# Patient Record
Sex: Female | Born: 1965 | Race: White | Hispanic: No | Marital: Married | Smoking: Never smoker
Health system: Southern US, Community
[De-identification: ages and names within clinical notes are randomized; demographics above are authoritative.]

## PROBLEM LIST (undated history)

## (undated) DIAGNOSIS — R569 Unspecified convulsions: Secondary | ICD-10-CM

## (undated) DIAGNOSIS — R413 Other amnesia: Secondary | ICD-10-CM

## (undated) DIAGNOSIS — N951 Menopausal and female climacteric states: Secondary | ICD-10-CM

## (undated) HISTORY — DX: Unspecified convulsions: R56.9

## (undated) HISTORY — PX: OTHER SURGICAL HISTORY: SHX169

## (undated) HISTORY — DX: Menopausal and female climacteric states: N95.1

## (undated) HISTORY — PX: BRAIN SURGERY: SHX531

## (undated) HISTORY — DX: Other amnesia: R41.3

---

## 2005-09-04 HISTORY — PX: ABOVE ELBOW ARM AMPUTATION: SUR27

## 2007-04-08 ENCOUNTER — Ambulatory Visit (HOSPITAL_COMMUNITY): Admission: RE | Admit: 2007-04-08 | Discharge: 2007-04-08 | Payer: Self-pay | Admitting: Internal Medicine

## 2007-04-24 ENCOUNTER — Ambulatory Visit (HOSPITAL_COMMUNITY): Admission: RE | Admit: 2007-04-24 | Discharge: 2007-04-24 | Payer: Self-pay | Admitting: Internal Medicine

## 2007-10-30 ENCOUNTER — Ambulatory Visit (HOSPITAL_COMMUNITY): Admission: RE | Admit: 2007-10-30 | Discharge: 2007-10-30 | Payer: Self-pay | Admitting: Internal Medicine

## 2008-04-15 ENCOUNTER — Ambulatory Visit (HOSPITAL_COMMUNITY): Admission: RE | Admit: 2008-04-15 | Discharge: 2008-04-15 | Payer: Self-pay | Admitting: Internal Medicine

## 2008-06-03 ENCOUNTER — Other Ambulatory Visit: Admission: RE | Admit: 2008-06-03 | Discharge: 2008-06-03 | Payer: Self-pay | Admitting: Obstetrics and Gynecology

## 2008-07-22 ENCOUNTER — Encounter: Payer: Self-pay | Admitting: Orthopedic Surgery

## 2008-08-04 ENCOUNTER — Encounter: Payer: Self-pay | Admitting: Orthopedic Surgery

## 2008-09-04 ENCOUNTER — Encounter: Payer: Self-pay | Admitting: Orthopedic Surgery

## 2008-10-05 ENCOUNTER — Encounter: Payer: Self-pay | Admitting: Orthopedic Surgery

## 2008-11-02 ENCOUNTER — Encounter: Payer: Self-pay | Admitting: Orthopedic Surgery

## 2009-04-19 ENCOUNTER — Ambulatory Visit (HOSPITAL_COMMUNITY): Admission: RE | Admit: 2009-04-19 | Discharge: 2009-04-19 | Payer: Self-pay | Admitting: Internal Medicine

## 2009-06-17 ENCOUNTER — Other Ambulatory Visit: Admission: RE | Admit: 2009-06-17 | Discharge: 2009-06-17 | Payer: Self-pay | Admitting: Obstetrics and Gynecology

## 2010-04-21 ENCOUNTER — Ambulatory Visit (HOSPITAL_COMMUNITY): Admission: RE | Admit: 2010-04-21 | Discharge: 2010-04-21 | Payer: Self-pay | Admitting: Internal Medicine

## 2010-07-21 ENCOUNTER — Other Ambulatory Visit: Admission: RE | Admit: 2010-07-21 | Discharge: 2010-07-21 | Payer: Self-pay | Admitting: Obstetrics and Gynecology

## 2011-03-20 ENCOUNTER — Other Ambulatory Visit (HOSPITAL_COMMUNITY): Payer: Self-pay | Admitting: Internal Medicine

## 2011-03-20 DIAGNOSIS — Z139 Encounter for screening, unspecified: Secondary | ICD-10-CM

## 2011-04-25 ENCOUNTER — Ambulatory Visit (HOSPITAL_COMMUNITY): Payer: Medicare Other

## 2011-05-01 ENCOUNTER — Ambulatory Visit (HOSPITAL_COMMUNITY)
Admission: RE | Admit: 2011-05-01 | Discharge: 2011-05-01 | Disposition: A | Discharge status: Home / Self Care | Payer: Medicare Other | Source: Ambulatory Visit | Attending: Internal Medicine | Admitting: Internal Medicine

## 2011-05-01 DIAGNOSIS — Z139 Encounter for screening, unspecified: Secondary | ICD-10-CM

## 2011-05-01 DIAGNOSIS — Z1231 Encounter for screening mammogram for malignant neoplasm of breast: Secondary | ICD-10-CM | POA: Insufficient documentation

## 2011-07-26 ENCOUNTER — Other Ambulatory Visit (HOSPITAL_COMMUNITY)
Admission: RE | Admit: 2011-07-26 | Discharge: 2011-07-26 | Disposition: A | Discharge status: Home / Self Care | Payer: Medicare Other | Source: Ambulatory Visit | Attending: Obstetrics and Gynecology | Admitting: Obstetrics and Gynecology

## 2011-07-26 ENCOUNTER — Other Ambulatory Visit: Payer: Self-pay | Admitting: Family Medicine

## 2011-07-26 DIAGNOSIS — Z01419 Encounter for gynecological examination (general) (routine) without abnormal findings: Secondary | ICD-10-CM | POA: Insufficient documentation

## 2012-07-29 ENCOUNTER — Other Ambulatory Visit (HOSPITAL_COMMUNITY)
Admission: RE | Admit: 2012-07-29 | Discharge: 2012-07-29 | Disposition: A | Discharge status: Home / Self Care | Payer: Medicare Other | Source: Ambulatory Visit | Attending: Obstetrics and Gynecology | Admitting: Obstetrics and Gynecology

## 2012-07-29 ENCOUNTER — Other Ambulatory Visit: Payer: Self-pay | Admitting: Adult Health

## 2012-07-29 ENCOUNTER — Other Ambulatory Visit (HOSPITAL_COMMUNITY): Payer: Self-pay | Admitting: Internal Medicine

## 2012-07-29 DIAGNOSIS — Z1151 Encounter for screening for human papillomavirus (HPV): Secondary | ICD-10-CM | POA: Insufficient documentation

## 2012-07-29 DIAGNOSIS — Z01419 Encounter for gynecological examination (general) (routine) without abnormal findings: Secondary | ICD-10-CM | POA: Insufficient documentation

## 2012-07-29 DIAGNOSIS — Z139 Encounter for screening, unspecified: Secondary | ICD-10-CM

## 2012-08-06 ENCOUNTER — Ambulatory Visit (HOSPITAL_COMMUNITY): Payer: Medicare Other

## 2012-08-12 ENCOUNTER — Ambulatory Visit (HOSPITAL_COMMUNITY)
Admission: RE | Admit: 2012-08-12 | Discharge: 2012-08-12 | Disposition: A | Discharge status: Home / Self Care | Payer: Medicare Other | Source: Ambulatory Visit | Attending: Internal Medicine | Admitting: Internal Medicine

## 2012-08-12 DIAGNOSIS — Z139 Encounter for screening, unspecified: Secondary | ICD-10-CM

## 2012-08-12 DIAGNOSIS — Z1231 Encounter for screening mammogram for malignant neoplasm of breast: Secondary | ICD-10-CM | POA: Insufficient documentation

## 2012-08-13 DIAGNOSIS — R569 Unspecified convulsions: Secondary | ICD-10-CM | POA: Insufficient documentation

## 2013-07-07 ENCOUNTER — Other Ambulatory Visit (HOSPITAL_COMMUNITY): Payer: Self-pay | Admitting: Internal Medicine

## 2013-07-07 DIAGNOSIS — Z139 Encounter for screening, unspecified: Secondary | ICD-10-CM

## 2013-08-04 ENCOUNTER — Encounter: Payer: Self-pay | Admitting: Adult Health

## 2013-08-04 ENCOUNTER — Ambulatory Visit (INDEPENDENT_AMBULATORY_CARE_PROVIDER_SITE_OTHER): Payer: No Typology Code available for payment source | Admitting: Adult Health

## 2013-08-04 ENCOUNTER — Encounter (INDEPENDENT_AMBULATORY_CARE_PROVIDER_SITE_OTHER): Payer: Self-pay

## 2013-08-04 VITALS — BP 120/80 | HR 76 | Ht 65.0 in | Wt 242.0 lb

## 2013-08-04 DIAGNOSIS — N951 Menopausal and female climacteric states: Secondary | ICD-10-CM

## 2013-08-04 DIAGNOSIS — Z01419 Encounter for gynecological examination (general) (routine) without abnormal findings: Secondary | ICD-10-CM

## 2013-08-04 DIAGNOSIS — Z1212 Encounter for screening for malignant neoplasm of rectum: Secondary | ICD-10-CM

## 2013-08-04 HISTORY — DX: Menopausal and female climacteric states: N95.1

## 2013-08-04 LAB — HEMOCCULT GUIAC POC 1CARD (OFFICE)
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
## 2013-08-04 NOTE — Patient Instructions (Signed)
Physical in 1 year Mammogram yearly Colonoscopy at 39 Labs with PCP Perimenopause Perimenopause is the time when your body begins to move into the menopause (no menstrual period for 12 straight months). It is a natural process. Perimenopause can begin 2 to 8 years before the menopause and usually lasts for one year after the menopause. During this time, your ovaries may or may not produce an egg. The ovaries vary in their production of estrogen and progesterone hormones each month. This can cause irregular menstrual periods, difficulty in getting pregnant, vaginal bleeding between periods and uncomfortable symptoms. CAUSES  Irregular production of the ovarian hormones, estrogen and progesterone, and not ovulating every month.  Other causes include:  Tumor of the pituitary gland in the brain.  Medical disease that affects the ovaries.  Radiation treatment.  Chemotherapy.  Unknown causes.  Heavy smoking and excessive alcohol intake can bring on perimenopause sooner. SYMPTOMS   Hot flashes.  Night sweats.  Irregular menstrual periods.  Decrease sex drive.  Vaginal dryness.  Headaches.  Mood swings.  Depression.  Memory problems.  Irritability.  Tiredness.  Weight gain.  Trouble getting pregnant.  The beginning of losing bone cells (osteoporosis).  The beginning of hardening of the arteries (atherosclerosis). DIAGNOSIS  Your caregiver will make a diagnosis by analyzing your age, menstrual history and your symptoms. They will do a physical exam noting any changes in your body, especially your female organs. Female hormone tests may or may not be helpful depending on the amount and when you produce the female hormones. However, other hormone tests may be helpful (ex. thyroid hormone) to rule out other problems. TREATMENT  The decision to treat during the perimenopause should be made by you and your caregiver depending on how the symptoms are affecting you and your  life style. There are various treatments available such as:  Treating individual symptoms with a specific medication for that symptom (ex. tranquilizer for depression).  Herbal medications that can help specific symptoms.  Counseling.  Group therapy.  No treatment. HOME CARE INSTRUCTIONS   Before seeing your caregiver, make a list of your menstrual periods (when the occur, how heavy they are, how long between periods and how long they last), your symptoms and when they started.  Take the medication as recommended by your caregiver.  Sleep and rest.  Exercise.  Eat a diet that contains calcium (good for your bones) and soy (acts like estrogen hormone).  Do not smoke.  Avoid alcoholic beverages.  Taking vitamin E may help in certain cases.  Take calcium and vitamin D supplements to help prevent bone loss.  Group therapy is sometimes helpful.  Acupuncture may help in some cases. SEEK MEDICAL CARE IF:   You have any of the above and want to know if it is perimenopause.  You want advice and treatment for any of your symptoms mentioned above.  You need a referral to a specialist (gynecologist, psychiatrist or psychologist). SEEK IMMEDIATE MEDICAL CARE IF:   You have vaginal bleeding.  Your period lasts longer than 8 days.  You periods are recurring sooner than 21 days.  You have bleeding after intercourse.  You have severe depression.  You have pain when you urinate.  You have severe headaches.  You develop vision problems. Document Released: 09/28/2004 Document Revised: 11/13/2011 Document Reviewed: 03/20/2013 [facility] Patient Information 2014 [place], [place].

## 2013-08-18 ENCOUNTER — Ambulatory Visit (HOSPITAL_COMMUNITY)
Admission: RE | Admit: 2013-08-18 | Discharge: 2013-08-18 | Disposition: A | Discharge status: Home / Self Care | Payer: Medicare Other | Source: Ambulatory Visit | Attending: Internal Medicine | Admitting: Internal Medicine

## 2013-08-18 DIAGNOSIS — Z139 Encounter for screening, unspecified: Secondary | ICD-10-CM

## 2013-08-18 DIAGNOSIS — Z1231 Encounter for screening mammogram for malignant neoplasm of breast: Secondary | ICD-10-CM | POA: Insufficient documentation

## 2014-04-28 ENCOUNTER — Other Ambulatory Visit (HOSPITAL_COMMUNITY): Payer: Self-pay | Admitting: Internal Medicine

## 2014-04-28 DIAGNOSIS — Z1231 Encounter for screening mammogram for malignant neoplasm of breast: Secondary | ICD-10-CM

## 2014-08-10 ENCOUNTER — Ambulatory Visit (INDEPENDENT_AMBULATORY_CARE_PROVIDER_SITE_OTHER): Payer: No Typology Code available for payment source | Admitting: Adult Health

## 2014-08-10 ENCOUNTER — Encounter: Payer: Self-pay | Admitting: Adult Health

## 2014-08-10 VITALS — BP 124/80 | HR 76 | Ht 66.0 in | Wt 244.5 lb

## 2014-08-10 DIAGNOSIS — N951 Menopausal and female climacteric states: Secondary | ICD-10-CM

## 2014-08-10 DIAGNOSIS — Z01419 Encounter for gynecological examination (general) (routine) without abnormal findings: Secondary | ICD-10-CM

## 2014-08-10 NOTE — Patient Instructions (Addendum)
Pap and physical in 1 year Mammogram 12/17 and yearly  Colonoscopy at [place] to take ASA 81 mg Vitamin D3 2000 IU daily Menopause Menopause is the normal time of life when menstrual periods stop completely. Menopause is complete when you have missed 12 consecutive menstrual periods. It usually occurs between the ages of 16 years and 40 years. Very rarely does a woman develop menopause before the age of 4 years. At menopause, your ovaries stop producing the female hormones estrogen and progesterone. This can cause undesirable symptoms and also affect your health. Sometimes the symptoms may occur 4-5 years before the menopause begins. There is no relationship between menopause and:  Oral contraceptives.  Number of children you had.  Race.  The age your menstrual periods started (menarche). Heavy smokers and very thin women may develop menopause earlier in life. CAUSES  The ovaries stop producing the female hormones estrogen and progesterone.  Other causes include:  Surgery to remove both ovaries.  The ovaries stop functioning for no known reason.  Tumors of the pituitary gland in the brain.  Medical disease that affects the ovaries and hormone production.  Radiation treatment to the abdomen or pelvis.  Chemotherapy that affects the ovaries. SYMPTOMS   Hot flashes.  Night sweats.  Decrease in sex drive.  Vaginal dryness and thinning of the vagina causing painful intercourse.  Dryness of the skin and developing wrinkles.  Headaches.  Tiredness.  Irritability.  Memory problems.  Weight gain.  Bladder infections.  Hair growth of the face and chest.  Infertility. More serious symptoms include:  Loss of bone (osteoporosis) causing breaks (fractures).  Depression.  Hardening and narrowing of the arteries (atherosclerosis) causing heart attacks and strokes. DIAGNOSIS   When the menstrual periods have stopped for 12 straight months.  Physical  exam.  Hormone studies of the blood. TREATMENT  There are many treatment choices and nearly as many questions about them. The decisions to treat or not to treat menopausal changes is an individual choice made with your health care provider. Your health care provider can discuss the treatments with you. Together, you can decide which treatment will work best for you. Your treatment choices may include:   Hormone therapy (estrogen and progesterone).  Non-hormonal medicines.  Treating the individual symptoms with medicine (for example antidepressants for depression).  Herbal medicines that may help specific symptoms.  Counseling by a psychiatrist or psychologist.  Group therapy.  Lifestyle changes including:  Eating healthy.  Regular exercise.  Limiting caffeine and alcohol.  Stress management and meditation.  No treatment. HOME CARE INSTRUCTIONS   Take the medicine your health care provider gives you as directed.  Get plenty of sleep and rest.  Exercise regularly.  Eat a diet that contains calcium (good for the bones) and soy products (acts like estrogen hormone).  Avoid alcoholic beverages.  Do not smoke.  If you have hot flashes, dress in layers.  Take supplements, calcium, and vitamin D to strengthen bones.  You can use over-the-counter lubricants or moisturizers for vaginal dryness.  Group therapy is sometimes very helpful.  Acupuncture may be helpful in some cases. SEEK MEDICAL CARE IF:   You are not sure you are in menopause.  You are having menopausal symptoms and need advice and treatment.  You are still having menstrual periods after age 57 years.  You have pain with intercourse.  Menopause is complete (no menstrual period for 12 months) and you develop vaginal bleeding.  You need a referral to a  specialist (gynecologist, psychiatrist, or psychologist) for treatment. SEEK IMMEDIATE MEDICAL CARE IF:   You have severe depression.  You have  excessive vaginal bleeding.  You fell and think you have a broken bone.  You have pain when you urinate.  You develop leg or chest pain.  You have a fast pounding heart beat (palpitations).  You have severe headaches.  You develop vision problems.  You feel a lump in your breast.  You have abdominal pain or severe indigestion. Document Released: 11/11/2003 Document Revised: 04/23/2013 Document Reviewed: 03/20/2013 [facility] Patient Information 2015 [place], [place]. This information is not intended to replace advice given to you by your health care provider. Make sure you discuss any questions you have with your health care provider. Perimenopause Perimenopause is the time when your body begins to move into the menopause (no menstrual period for 12 straight months). It is a natural process. Perimenopause can begin 2-8 years before the menopause and usually lasts for 1 year after the menopause. During this time, your ovaries may or may not produce an egg. The ovaries vary in their production of estrogen and progesterone hormones each month. This can cause irregular menstrual periods, difficulty getting pregnant, vaginal bleeding between periods, and uncomfortable symptoms. CAUSES  Irregular production of the ovarian hormones, estrogen and progesterone, and not ovulating every month.  Other causes include:  Tumor of the pituitary gland in the brain.  Medical disease that affects the ovaries.  Radiation treatment.  Chemotherapy.  Unknown causes.  Heavy smoking and excessive alcohol intake can bring on perimenopause sooner. SIGNS AND SYMPTOMS   Hot flashes.  Night sweats.  Irregular menstrual periods.  Decreased sex drive.  Vaginal dryness.  Headaches.  Mood swings.  Depression.  Memory problems.  Irritability.  Tiredness.  Weight gain.  Trouble getting pregnant.  The beginning of losing bone cells (osteoporosis).  The beginning of hardening of the  arteries (atherosclerosis). DIAGNOSIS  Your health care provider will make a diagnosis by analyzing your age, menstrual history, and symptoms. He or she will do a physical exam and note any changes in your body, especially your female organs. Female hormone tests may or may not be helpful depending on the amount of female hormones you produce and when you produce them. However, other hormone tests may be helpful to rule out other problems. TREATMENT  In some cases, no treatment is needed. The decision on whether treatment is necessary during the perimenopause should be made by you and your health care provider based on how the symptoms are affecting you and your lifestyle. Various treatments are available, such as:  Treating individual symptoms with a specific medicine for that symptom.  Herbal medicines that can help specific symptoms.  Counseling.  Group therapy. HOME CARE INSTRUCTIONS   Keep track of your menstrual periods (when they occur, how heavy they are, how long between periods, and how long they last) as well as your symptoms and when they started.  Only take over-the-counter or prescription medicines as directed by your health care provider.  Sleep and rest.  Exercise.  Eat a diet that contains calcium (good for your bones) and soy (acts like the estrogen hormone).  Do not smoke.  Avoid alcoholic beverages.  Take vitamin supplements as recommended by your health care provider. Taking vitamin E may help in certain cases.  Take calcium and vitamin D supplements to help prevent bone loss.  Group therapy is sometimes helpful.  Acupuncture may help in some cases. SEEK MEDICAL CARE IF:  You have questions about any symptoms you are having.  You need a referral to a specialist (gynecologist, psychiatrist, or psychologist). SEEK IMMEDIATE MEDICAL CARE IF:   You have vaginal bleeding.  Your period lasts longer than 8 days.  Your periods are recurring sooner than 21  days.  You have bleeding after intercourse.  You have severe depression.  You have pain when you urinate.  You have severe headaches.  You have vision problems. Document Released: 09/28/2004 Document Revised: 06/11/2013 Document Reviewed: 03/20/2013 [facility] Patient Information 2015 [place], [place]. This information is not intended to replace advice given to you by your health care provider. Make sure you discuss any questions you have with your health care provider.

## 2014-08-10 NOTE — Progress Notes (Signed)
Patient ID: Stacey Ford, female   DOB: 1966/01/31, 48 y.o.   MRN: [id number] History of Present Illness: Stacey Ford is a 48 year old white female in for gyn exam, she had normal pap with negative HPV 07/29/12. Had labs with Dr Willey Blade in [place] and got flu shot in September.  Current Medications, Allergies, Past Medical History, Past Surgical History, Family History and Social History were reviewed in [employer] record.     Review of Systems: Patient denies any headaches, blurred vision, shortness of breath, chest pain, abdominal pain, problems with bowel movements, urination, or intercourse. She has had some palpitations, esp when having hot flash, and not sleeping great, wakes up between 2-3 am.No joint pain or mood swings.    Physical Exam:BP 124/80 mmHg  Pulse 76  Ht 5\' 6"  (1.676 m)  Wt 244 lb 8 oz (110.904 kg)  BMI 39.48 kg/m2  LMP 07/30/2014 General:  Well developed, well nourished, no acute distress Skin:  Warm and dry Neck:  Midline trachea, normal thyroid Lungs; Clear to auscultation bilaterally Breast:  No dominant palpable mass, retraction, or nipple discharge Cardiovascular: Regular rate and rhythm Abdomen:  Soft, non tender, no hepatosplenomegaly Pelvic:  External genitalia is normal in appearance.  The vagina is normal in appearance.  The cervix is smooth.uterus normal size, shape, and contour.  No adnexal masses or tenderness noted. Rectal: Good sphincter tone, no polyps, or hemorrhoids felt.  Hemoccult negative. Extremities:  No swelling or varicosities noted Psych:  No mood changes,alert and cooperative,seems happy Discussed SSRI for hot flashes, will just follow for now  Impression: Well woman gyn exam no pap Peri menopause   Plan: Pap and physical in 1 year Mammogram 12/17 and yearly  Colonoscopy at 49 Take 81 mg ASA daily and vitamin D3 2000 IU daily Call if palpitations increase, may get monitor Review handout on peri menopause and  menopause

## 2014-08-20 ENCOUNTER — Ambulatory Visit (HOSPITAL_COMMUNITY)
Admission: RE | Admit: 2014-08-20 | Discharge: 2014-08-20 | Disposition: A | Discharge status: Home / Self Care | Payer: Medicare Other | Source: Ambulatory Visit | Attending: Internal Medicine | Admitting: Internal Medicine

## 2014-08-20 DIAGNOSIS — Z1231 Encounter for screening mammogram for malignant neoplasm of breast: Secondary | ICD-10-CM | POA: Insufficient documentation

## 2014-09-24 ENCOUNTER — Ambulatory Visit (INDEPENDENT_AMBULATORY_CARE_PROVIDER_SITE_OTHER): Payer: Medicare Other | Admitting: Otolaryngology

## 2014-09-24 DIAGNOSIS — J310 Chronic rhinitis: Secondary | ICD-10-CM

## 2014-09-24 DIAGNOSIS — H6983 Other specified disorders of Eustachian tube, bilateral: Secondary | ICD-10-CM

## 2014-10-16 ENCOUNTER — Encounter (HOSPITAL_COMMUNITY): Payer: Self-pay | Admitting: Emergency Medicine

## 2014-10-16 ENCOUNTER — Emergency Department (HOSPITAL_COMMUNITY): Payer: Medicare Other

## 2014-10-16 ENCOUNTER — Emergency Department (HOSPITAL_COMMUNITY)
Admission: EM | Admit: 2014-10-16 | Discharge: 2014-10-16 | Disposition: A | Discharge status: Home / Self Care | Payer: Medicare Other | Attending: Emergency Medicine | Admitting: Emergency Medicine

## 2014-10-16 DIAGNOSIS — R0602 Shortness of breath: Secondary | ICD-10-CM | POA: Insufficient documentation

## 2014-10-16 DIAGNOSIS — Z7951 Long term (current) use of inhaled steroids: Secondary | ICD-10-CM | POA: Insufficient documentation

## 2014-10-16 DIAGNOSIS — K047 Periapical abscess without sinus: Secondary | ICD-10-CM | POA: Diagnosis not present

## 2014-10-16 DIAGNOSIS — H5710 Ocular pain, unspecified eye: Secondary | ICD-10-CM | POA: Diagnosis not present

## 2014-10-16 DIAGNOSIS — Z880 Allergy status to penicillin: Secondary | ICD-10-CM | POA: Insufficient documentation

## 2014-10-16 DIAGNOSIS — R51 Headache: Secondary | ICD-10-CM | POA: Diagnosis not present

## 2014-10-16 DIAGNOSIS — G40909 Epilepsy, unspecified, not intractable, without status epilepticus: Secondary | ICD-10-CM | POA: Diagnosis not present

## 2014-10-16 DIAGNOSIS — J0100 Acute maxillary sinusitis, unspecified: Secondary | ICD-10-CM | POA: Diagnosis not present

## 2014-10-16 DIAGNOSIS — Z8742 Personal history of other diseases of the female genital tract: Secondary | ICD-10-CM | POA: Insufficient documentation

## 2014-10-16 DIAGNOSIS — Z79899 Other long term (current) drug therapy: Secondary | ICD-10-CM | POA: Insufficient documentation

## 2014-10-16 DIAGNOSIS — R05 Cough: Secondary | ICD-10-CM | POA: Diagnosis present

## 2014-10-16 MED ORDER — HYDROCODONE-ACETAMINOPHEN 5-325 MG PO TABS: 1.0000 | ORAL_TABLET | Freq: Four times a day (QID) | ORAL | Status: DC | PRN

## 2014-10-16 MED ORDER — CLINDAMYCIN HCL 150 MG PO CAPS: 450.0000 mg | ORAL_CAPSULE | Freq: Three times a day (TID) | ORAL | Status: DC

## 2014-10-16 MED ORDER — LEVOFLOXACIN 500 MG PO TABS: 500.0000 mg | ORAL_TABLET | Freq: Every day | ORAL | Status: DC

## 2014-10-16 NOTE — ED Notes (Signed)
MD at bedside. 
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
## 2014-10-16 NOTE — Discharge Instructions (Signed)
As we discussed appears to be a left upper molar tooth infection any acute sinusitis. The tooth infection could've created this is also possible that it went the other way that the sinus infection is eroding into the area of the tooth. Will treat with 2 different antibiotics based on this. Take as directed. Take pain medicine as needed. Keep your follow-up with ear nose and throat. Make an appointment to follow-up with your dentist.

## 2014-10-16 NOTE — ED Notes (Signed)
Pt states that she has been experiencing sinus symptoms with cough for a few days now and has been using flonase.  Now feels that she is having swelling on the left side of her face since yesterday with some pain on that side.  Also coughing up thick sputum.

## 2014-10-22 ENCOUNTER — Ambulatory Visit (INDEPENDENT_AMBULATORY_CARE_PROVIDER_SITE_OTHER): Payer: Medicare Other | Admitting: Otolaryngology

## 2014-10-22 DIAGNOSIS — J0101 Acute recurrent maxillary sinusitis: Secondary | ICD-10-CM

## 2014-10-22 DIAGNOSIS — J310 Chronic rhinitis: Secondary | ICD-10-CM

## 2014-11-12 ENCOUNTER — Ambulatory Visit (INDEPENDENT_AMBULATORY_CARE_PROVIDER_SITE_OTHER): Payer: Medicare Other | Admitting: Otolaryngology

## 2014-11-12 DIAGNOSIS — J310 Chronic rhinitis: Secondary | ICD-10-CM

## 2014-11-12 DIAGNOSIS — J343 Hypertrophy of nasal turbinates: Secondary | ICD-10-CM | POA: Diagnosis not present

## 2015-08-12 ENCOUNTER — Encounter: Payer: Self-pay | Admitting: Adult Health

## 2015-08-12 ENCOUNTER — Other Ambulatory Visit (HOSPITAL_COMMUNITY): Payer: Self-pay | Admitting: Internal Medicine

## 2015-08-12 ENCOUNTER — Ambulatory Visit (INDEPENDENT_AMBULATORY_CARE_PROVIDER_SITE_OTHER): Payer: Medicare Other | Admitting: Adult Health

## 2015-08-12 ENCOUNTER — Other Ambulatory Visit (HOSPITAL_COMMUNITY)
Admission: RE | Admit: 2015-08-12 | Discharge: 2015-08-12 | Disposition: A | Discharge status: Home / Self Care | Payer: Medicare Other | Source: Ambulatory Visit | Attending: Adult Health | Admitting: Adult Health

## 2015-08-12 VITALS — BP 110/70 | HR 62 | Ht 65.5 in | Wt 248.5 lb

## 2015-08-12 DIAGNOSIS — Z01419 Encounter for gynecological examination (general) (routine) without abnormal findings: Secondary | ICD-10-CM | POA: Diagnosis present

## 2015-08-12 DIAGNOSIS — Z1151 Encounter for screening for human papillomavirus (HPV): Secondary | ICD-10-CM | POA: Insufficient documentation

## 2015-08-12 DIAGNOSIS — Z1212 Encounter for screening for malignant neoplasm of rectum: Secondary | ICD-10-CM

## 2015-08-12 DIAGNOSIS — N951 Menopausal and female climacteric states: Secondary | ICD-10-CM

## 2015-08-12 DIAGNOSIS — Z1231 Encounter for screening mammogram for malignant neoplasm of breast: Secondary | ICD-10-CM

## 2015-08-12 LAB — HEMOCCULT GUIAC POC 1CARD (OFFICE): Fecal Occult Blood, POC: NEGATIVE

## 2015-08-12 NOTE — Patient Instructions (Signed)
Physical in 1 year Pap in 3 if normal Mammogram yearly Colonoscopy per GI Labs with PCP  

## 2015-08-12 NOTE — Progress Notes (Signed)
Patient ID: Stacey Ford, female   DOB: 08-03-1966, 49 y.o.   MRN: [id number] History of Present Illness:  Stacey Ford is a 49 year old white female, married,G0P0, in for a well woman gyn exam and pap. PCP is Dr Willey Blade.  She got flu shot end of August and had colonoscopy on October and is good for 10 years.  Current Medications, Allergies, Past Medical History, Past Surgical History, Family History and Social History were reviewed in [employer] record.     Review of Systems: Patient denies any headaches, hearing loss, fatigue, blurred vision, shortness of breath, chest pain, abdominal pain, problems with bowel movements, urination, or intercourse. No joint pain. She is still having periods, but cycle is irregular, occasional hot flash and moody at times.    Physical Exam:BP 110/70 mmHg  Pulse 62  Ht 5' 5.5" (1.664 m)  Wt 248 lb 8 oz (112.719 kg)  BMI 40.71 kg/m2  LMP 07/22/2015 General:  Well developed, well nourished, no acute distress Skin:  Warm and dry Neck:  Midline trachea, normal thyroid, good ROM, no lymphadenopathy Lungs; Clear to auscultation bilaterally Breast:  No dominant palpable mass, retraction, or nipple discharge Cardiovascular: Regular rate and rhythm Abdomen:  Soft, non tender, no hepatosplenomegaly Pelvic:  External genitalia is normal in appearance, no lesions.  The vagina is normal in appearance. Urethra has no lesions or masses. The cervix is smooth, pap with HPV performed.  Uterus is felt to be normal size, shape, and contour.  No adnexal masses or tenderness noted.Bladder is non tender, no masses felt. Rectal: Good sphincter tone, no polyps, or hemorrhoids felt.  Hemoccult negative. Extremities/musculoskeletal:  No swelling or varicosities noted, no clubbing or cyanosis, has scarring left arm from surgery. Psych:  No mood changes, alert and cooperative,seems happy Discussed menopausal symptoms, declines meds at present.   Impression: Well  woman gyn exam with pap Peri menopause     Plan: Physical in 1 year, pap in 3 if normal Mammogram yearly Labs with PCP Colonoscopy per Gi

## 2015-08-16 LAB — CYTOLOGY - PAP

## 2015-09-16 ENCOUNTER — Ambulatory Visit (HOSPITAL_COMMUNITY)
Admission: RE | Admit: 2015-09-16 | Discharge: 2015-09-16 | Disposition: A | Discharge status: Home / Self Care | Payer: Medicare Other | Source: Ambulatory Visit | Attending: Internal Medicine | Admitting: Internal Medicine

## 2015-09-16 DIAGNOSIS — Z1231 Encounter for screening mammogram for malignant neoplasm of breast: Secondary | ICD-10-CM | POA: Diagnosis not present

## 2016-01-10 DIAGNOSIS — R569 Unspecified convulsions: Secondary | ICD-10-CM | POA: Diagnosis not present

## 2016-02-15 DIAGNOSIS — N183 Chronic kidney disease, stage 3 (moderate): Secondary | ICD-10-CM | POA: Diagnosis not present

## 2016-02-15 DIAGNOSIS — R569 Unspecified convulsions: Secondary | ICD-10-CM | POA: Diagnosis not present

## 2016-02-15 DIAGNOSIS — Z79899 Other long term (current) drug therapy: Secondary | ICD-10-CM | POA: Diagnosis not present

## 2016-02-15 DIAGNOSIS — D649 Anemia, unspecified: Secondary | ICD-10-CM | POA: Diagnosis not present

## 2016-02-24 DIAGNOSIS — Z0001 Encounter for general adult medical examination with abnormal findings: Secondary | ICD-10-CM | POA: Diagnosis not present

## 2016-02-24 DIAGNOSIS — N183 Chronic kidney disease, stage 3 (moderate): Secondary | ICD-10-CM | POA: Diagnosis not present

## 2016-03-09 DIAGNOSIS — D225 Melanocytic nevi of trunk: Secondary | ICD-10-CM | POA: Diagnosis not present

## 2016-03-09 DIAGNOSIS — D2261 Melanocytic nevi of right upper limb, including shoulder: Secondary | ICD-10-CM | POA: Diagnosis not present

## 2016-03-09 DIAGNOSIS — L821 Other seborrheic keratosis: Secondary | ICD-10-CM | POA: Diagnosis not present

## 2016-07-03 DIAGNOSIS — H5213 Myopia, bilateral: Secondary | ICD-10-CM | POA: Diagnosis not present

## 2016-07-03 DIAGNOSIS — H524 Presbyopia: Secondary | ICD-10-CM | POA: Diagnosis not present

## 2016-08-07 ENCOUNTER — Other Ambulatory Visit (HOSPITAL_COMMUNITY): Payer: Self-pay | Admitting: Internal Medicine

## 2016-08-07 DIAGNOSIS — Z1231 Encounter for screening mammogram for malignant neoplasm of breast: Secondary | ICD-10-CM

## 2016-08-14 ENCOUNTER — Ambulatory Visit (INDEPENDENT_AMBULATORY_CARE_PROVIDER_SITE_OTHER): Payer: Medicare Other | Admitting: Adult Health

## 2016-08-14 ENCOUNTER — Encounter: Payer: Self-pay | Admitting: Adult Health

## 2016-08-14 VITALS — BP 122/84 | HR 66 | Ht 66.0 in | Wt 254.5 lb

## 2016-08-14 DIAGNOSIS — Z1211 Encounter for screening for malignant neoplasm of colon: Secondary | ICD-10-CM | POA: Insufficient documentation

## 2016-08-14 DIAGNOSIS — N951 Menopausal and female climacteric states: Secondary | ICD-10-CM

## 2016-08-14 DIAGNOSIS — Z01419 Encounter for gynecological examination (general) (routine) without abnormal findings: Secondary | ICD-10-CM | POA: Diagnosis not present

## 2016-08-14 DIAGNOSIS — Z1212 Encounter for screening for malignant neoplasm of rectum: Secondary | ICD-10-CM

## 2016-08-14 LAB — HEMOCCULT GUIAC POC 1CARD (OFFICE): Fecal Occult Blood, POC: NEGATIVE

## 2016-08-14 NOTE — Progress Notes (Signed)
Patient ID: Stacey Ford, female   DOB: 08/03/1966, 50 y.o.   MRN: [id number] History of Present Illness: Stacey Ford is a 50 year old white female in for well woman gyn exam, had normal pap with negative HPV 08/12/15.Periods irregular but hot flashes better. PCP is Dr Willey Blade.    Current Medications, Allergies, Past Medical History, Past Surgical History, Family History and Social History were reviewed in [employer] record.     Review of Systems: Patient denies any headaches, hearing loss, fatigue, blurred vision, shortness of breath, chest pain, abdominal pain, problems with bowel movements, urination, or intercourse. No joint pain or mood swings.    Physical Exam: BP 122/84 (BP Location: Right Arm, Patient Position: Sitting, Cuff Size: Large)   Pulse 66   Ht 5\' 6"  (1.676 m)   Wt 254 lb 8 oz (115.4 kg)   LMP 05/06/2016 (Approximate)   BMI 41.08 kg/m  General:  Well developed, well nourished, no acute distress Skin:  Warm and dry Neck:  Midline trachea, normal thyroid, good ROM, no lymphadenopathy Lungs; Clear to auscultation bilaterally Breast:  No dominant palpable mass, retraction, or nipple discharge Cardiovascular: Regular rate and rhythm Abdomen:  Soft, non tender, no hepatosplenomegaly Pelvic:  External genitalia is normal in appearance, no lesions.  The vagina is normal in appearance. Urethra has no lesions or masses. The cervix is smooth.  Uterus is felt to be normal size, shape, and contour.  No adnexal masses or tenderness noted.Bladder is non tender, no masses felt. Rectal: Good sphincter tone, no polyps, or hemorrhoids felt.  Hemoccult negative. Extremities/musculoskeletal:  No swelling or varicosities noted, no clubbing or cyanosis Psych:  No mood changes, alert and cooperative,seems happy PHQ 2 score 0.Discussed keeping tract of period, but once it has been 366 days and no period and if has any bleeding let me know.   Impression:  1. Well woman exam  with routine gynecological exam   2. Peri-menopausal      Plan:  Physical in 1 year Pap in 2019 Mammogram yearly Labs with PCP Colonoscopy per [facility] 05/2015)

## 2016-08-14 NOTE — Patient Instructions (Signed)
Physical in 1 year Pap in 2019 Mammogram yearly Labs with PCP Colonoscopy per GI

## 2016-09-18 ENCOUNTER — Encounter (HOSPITAL_COMMUNITY): Payer: Self-pay | Admitting: Radiology

## 2016-09-18 ENCOUNTER — Ambulatory Visit (HOSPITAL_COMMUNITY)
Admission: RE | Admit: 2016-09-18 | Discharge: 2016-09-18 | Disposition: A | Discharge status: Home / Self Care | Payer: Medicare Other | Source: Ambulatory Visit | Attending: Internal Medicine | Admitting: Internal Medicine

## 2016-09-18 DIAGNOSIS — Z1231 Encounter for screening mammogram for malignant neoplasm of breast: Secondary | ICD-10-CM | POA: Diagnosis not present

## 2017-02-21 DIAGNOSIS — N183 Chronic kidney disease, stage 3 (moderate): Secondary | ICD-10-CM | POA: Diagnosis not present

## 2017-02-21 DIAGNOSIS — Z79899 Other long term (current) drug therapy: Secondary | ICD-10-CM | POA: Diagnosis not present

## 2017-02-21 DIAGNOSIS — N179 Acute kidney failure, unspecified: Secondary | ICD-10-CM | POA: Diagnosis not present

## 2017-02-21 DIAGNOSIS — D649 Anemia, unspecified: Secondary | ICD-10-CM | POA: Diagnosis not present

## 2017-04-13 DIAGNOSIS — Z0001 Encounter for general adult medical examination with abnormal findings: Secondary | ICD-10-CM | POA: Diagnosis not present

## 2017-04-13 DIAGNOSIS — N183 Chronic kidney disease, stage 3 (moderate): Secondary | ICD-10-CM | POA: Diagnosis not present

## 2017-04-23 DIAGNOSIS — D225 Melanocytic nevi of trunk: Secondary | ICD-10-CM | POA: Diagnosis not present

## 2017-04-23 DIAGNOSIS — Z1283 Encounter for screening for malignant neoplasm of skin: Secondary | ICD-10-CM | POA: Diagnosis not present

## 2017-04-23 DIAGNOSIS — D485 Neoplasm of uncertain behavior of skin: Secondary | ICD-10-CM | POA: Diagnosis not present

## 2017-04-23 DIAGNOSIS — B078 Other viral warts: Secondary | ICD-10-CM | POA: Diagnosis not present

## 2017-06-01 DIAGNOSIS — Z23 Encounter for immunization: Secondary | ICD-10-CM | POA: Diagnosis not present

## 2017-07-11 DIAGNOSIS — H524 Presbyopia: Secondary | ICD-10-CM | POA: Diagnosis not present

## 2017-08-16 ENCOUNTER — Other Ambulatory Visit: Payer: Self-pay

## 2017-08-16 ENCOUNTER — Ambulatory Visit (INDEPENDENT_AMBULATORY_CARE_PROVIDER_SITE_OTHER): Payer: Medicare Other | Admitting: Adult Health

## 2017-08-16 ENCOUNTER — Encounter: Payer: Self-pay | Admitting: Adult Health

## 2017-08-16 VITALS — BP 124/82 | HR 71 | Ht 65.5 in | Wt 254.0 lb

## 2017-08-16 DIAGNOSIS — Z1211 Encounter for screening for malignant neoplasm of colon: Secondary | ICD-10-CM | POA: Diagnosis not present

## 2017-08-16 DIAGNOSIS — Z01419 Encounter for gynecological examination (general) (routine) without abnormal findings: Secondary | ICD-10-CM | POA: Diagnosis not present

## 2017-08-16 DIAGNOSIS — Z1212 Encounter for screening for malignant neoplasm of rectum: Secondary | ICD-10-CM

## 2017-08-16 LAB — HEMOCCULT GUIAC POC 1CARD (OFFICE): FECAL OCCULT BLD: NEGATIVE

## 2017-08-16 NOTE — Progress Notes (Signed)
Patient ID: Stacey Ford, female   DOB: 28-Oct-1965, 51 y.o.   MRN: [id number] History of Present Illness:  Stacey Ford is a 51 year old white female, married, PM in for well woman gyn exam, she had normal pap with negative HPV 08/12/15. PCP is Dr Willey Blade.  Current Medications, Allergies, Past Medical History, Past Surgical History, Family History and Social History were reviewed in [employer] record.     Review of Systems: Patient denies any headaches, hearing loss, fatigue, blurred vision, shortness of breath, chest pain, abdominal pain, problems with bowel movements, urination, or intercourse. No joint pain or mood swings.    Physical Exam:BP 124/82 (BP Location: Right Arm, Patient Position: Sitting, Cuff Size: Large)   Pulse 71   Ht 5' 5.5" (1.664 m)   Wt 254 lb (115.2 kg)   BMI 41.62 kg/m  General:  Well developed, well nourished, no acute distress Skin:  Warm and dry Neck:  Midline trachea, normal thyroid, good ROM, no lymphadenopathy Lungs; Clear to auscultation bilaterally Breast:  No dominant palpable mass, retraction, or nipple discharge Cardiovascular: Regular rate and rhythm Abdomen:  Soft, non tender, no hepatosplenomegaly Pelvic:  External genitalia is normal in appearance, no lesions.  The vagina is normal in appearance. Urethra has no lesions or masses. The cervix is bulbous.  Uterus is felt to be normal size, shape, and contour.  No adnexal masses or tenderness noted.Bladder is non tender, no masses felt. Rectal: Good sphincter tone, no polyps, or hemorrhoids felt.  Hemoccult negative. Extremities/musculoskeletal:  No swelling or varicosities noted, no clubbing or cyanosis Psych:  No mood changes, alert and cooperative,seems happy PHQ 2 score 0.  Impression: 1. Well woman exam with routine gynecological exam   2. Screening for colorectal cancer       Plan: Pap and physical in 1 year Mammogram yearly Labs with Dr Willey Blade Colonoscopy per GI

## 2017-08-22 DIAGNOSIS — L820 Inflamed seborrheic keratosis: Secondary | ICD-10-CM | POA: Diagnosis not present

## 2017-08-22 DIAGNOSIS — D485 Neoplasm of uncertain behavior of skin: Secondary | ICD-10-CM | POA: Diagnosis not present

## 2017-09-05 ENCOUNTER — Other Ambulatory Visit (HOSPITAL_COMMUNITY): Payer: Self-pay | Admitting: Internal Medicine

## 2017-09-05 DIAGNOSIS — Z1231 Encounter for screening mammogram for malignant neoplasm of breast: Secondary | ICD-10-CM

## 2017-09-26 ENCOUNTER — Ambulatory Visit (HOSPITAL_COMMUNITY): Payer: Medicare Other

## 2017-09-26 ENCOUNTER — Ambulatory Visit (HOSPITAL_COMMUNITY)
Admission: RE | Admit: 2017-09-26 | Discharge: 2017-09-26 | Disposition: A | Discharge status: Home / Self Care | Payer: Medicare Other | Source: Ambulatory Visit | Attending: Internal Medicine | Admitting: Internal Medicine

## 2017-09-26 DIAGNOSIS — Z1231 Encounter for screening mammogram for malignant neoplasm of breast: Secondary | ICD-10-CM | POA: Diagnosis not present

## 2018-01-07 DIAGNOSIS — R569 Unspecified convulsions: Secondary | ICD-10-CM | POA: Diagnosis not present

## 2018-03-12 DIAGNOSIS — D225 Melanocytic nevi of trunk: Secondary | ICD-10-CM | POA: Diagnosis not present

## 2018-03-12 DIAGNOSIS — B078 Other viral warts: Secondary | ICD-10-CM | POA: Diagnosis not present

## 2018-03-12 DIAGNOSIS — L820 Inflamed seborrheic keratosis: Secondary | ICD-10-CM | POA: Diagnosis not present

## 2018-03-12 DIAGNOSIS — Z1283 Encounter for screening for malignant neoplasm of skin: Secondary | ICD-10-CM | POA: Diagnosis not present

## 2018-04-26 DIAGNOSIS — N183 Chronic kidney disease, stage 3 (moderate): Secondary | ICD-10-CM | POA: Diagnosis not present

## 2018-04-26 DIAGNOSIS — Z79899 Other long term (current) drug therapy: Secondary | ICD-10-CM | POA: Diagnosis not present

## 2018-05-03 DIAGNOSIS — Z0001 Encounter for general adult medical examination with abnormal findings: Secondary | ICD-10-CM | POA: Diagnosis not present

## 2018-05-03 DIAGNOSIS — R202 Paresthesia of skin: Secondary | ICD-10-CM | POA: Diagnosis not present

## 2018-05-03 DIAGNOSIS — N183 Chronic kidney disease, stage 3 (moderate): Secondary | ICD-10-CM | POA: Diagnosis not present

## 2018-06-07 DIAGNOSIS — H524 Presbyopia: Secondary | ICD-10-CM | POA: Diagnosis not present

## 2018-08-26 ENCOUNTER — Other Ambulatory Visit (HOSPITAL_COMMUNITY): Payer: Self-pay | Admitting: Internal Medicine

## 2018-08-26 DIAGNOSIS — Z1231 Encounter for screening mammogram for malignant neoplasm of breast: Secondary | ICD-10-CM

## 2018-09-30 ENCOUNTER — Ambulatory Visit (HOSPITAL_COMMUNITY)
Admission: RE | Admit: 2018-09-30 | Discharge: 2018-09-30 | Disposition: A | Discharge status: Home / Self Care | Payer: Medicare Other | Source: Ambulatory Visit | Attending: Internal Medicine | Admitting: Internal Medicine

## 2018-09-30 DIAGNOSIS — Z1231 Encounter for screening mammogram for malignant neoplasm of breast: Secondary | ICD-10-CM | POA: Insufficient documentation

## 2018-10-01 ENCOUNTER — Other Ambulatory Visit (HOSPITAL_COMMUNITY)
Admission: RE | Admit: 2018-10-01 | Discharge: 2018-10-01 | Disposition: A | Discharge status: Home / Self Care | Payer: Medicare Other | Source: Ambulatory Visit | Attending: Adult Health | Admitting: Adult Health

## 2018-10-01 ENCOUNTER — Other Ambulatory Visit (HOSPITAL_COMMUNITY): Payer: Self-pay | Admitting: Internal Medicine

## 2018-10-01 ENCOUNTER — Ambulatory Visit (INDEPENDENT_AMBULATORY_CARE_PROVIDER_SITE_OTHER): Payer: Medicare Other | Admitting: Adult Health

## 2018-10-01 ENCOUNTER — Encounter: Payer: Self-pay | Admitting: Adult Health

## 2018-10-01 VITALS — BP 139/79 | HR 67 | Ht 65.0 in | Wt 256.0 lb

## 2018-10-01 DIAGNOSIS — R928 Other abnormal and inconclusive findings on diagnostic imaging of breast: Secondary | ICD-10-CM

## 2018-10-01 DIAGNOSIS — N951 Menopausal and female climacteric states: Secondary | ICD-10-CM | POA: Insufficient documentation

## 2018-10-01 DIAGNOSIS — Z01419 Encounter for gynecological examination (general) (routine) without abnormal findings: Secondary | ICD-10-CM | POA: Insufficient documentation

## 2018-10-01 DIAGNOSIS — Z1212 Encounter for screening for malignant neoplasm of rectum: Secondary | ICD-10-CM

## 2018-10-01 DIAGNOSIS — Z1211 Encounter for screening for malignant neoplasm of colon: Secondary | ICD-10-CM | POA: Diagnosis not present

## 2018-10-01 LAB — HEMOCCULT GUIAC POC 1CARD (OFFICE): Fecal Occult Blood, POC: NEGATIVE

## 2018-10-01 NOTE — Progress Notes (Signed)
Patient ID: Stacey Ford, female   DOB: 1966-03-19, 53 y.o.   MRN: [id number] History of Present Illness: Stacey Ford is a 53 year old white female, married, G0P0, in for well woman gyne xam and pap. PCP is Dr Willey Blade.   Current Medications, Allergies, Past Medical History, Past Surgical History, Family History and Social History were reviewed in [employer] record.     Review of Systems: Patient denies any headaches, hearing loss, fatigue, blurred vision, shortness of breath, chest pain, abdominal pain, problems with bowel movements, urination, or intercourse. No joint pain. Periods irregular, +hot flashes and night sweats, +moody   Physical Exam:BP 139/79 (BP Location: Right Arm, Patient Position: Sitting, Cuff Size: Large)   Pulse 67   Ht 5\' 5"  (1.651 m)   Wt 256 lb (116.1 kg)   LMP 07/22/2018 (Approximate)   BMI 42.60 kg/m  General:  Well developed, well nourished, no acute distress Skin:  Warm and dry Neck:  Midline trachea, normal thyroid, good ROM, no lymphadenopathy Lungs; Clear to auscultation bilaterally Breast:  No dominant palpable mass, retraction, or nipple discharge Cardiovascular: Regular rate and rhythm Abdomen:  Soft, non tender, no hepatosplenomegaly Pelvic:  External genitalia is normal in appearance, no lesions.  The vagina is normal in appearance. Urethra has no lesions or masses. The cervix is smooth, pap with HPV performed.  Uterus is felt to be normal size, shape, and contour.  No adnexal masses or tenderness noted.Bladder is non tender, no masses felt. Rectal: Good sphincter tone, no polyps, or hemorrhoids felt.  Hemoccult negative. Extremities/musculoskeletal:  No swelling or varicosities noted, no clubbing or cyanosis Psych:  No mood changes, alert and cooperative,seems happy PHQ 2 score 0. Fall risk is low. Examination chaperoned by Estill Bamberg Rash LPN. She declines HRT for now.   Impression: 1. Encounter for gynecological examination with  Papanicolaou smear of cervix   2. Screening for colorectal cancer   3. Perimenopause       Plan: Physical in 1 year Pap in 3 if normal Labs with PCP Colonoscopy per GI Had mammogram yesterday, had area left breast needs further imaging, pt aware, and will wait for the call

## 2018-10-01 NOTE — Addendum Note (Signed)
Addended by: Diona Fanti A on: 10/01/2018 03:04 PM   Modules accepted: Orders

## 2018-10-03 LAB — CYTOLOGY - PAP
Diagnosis: NEGATIVE
HPV: NOT DETECTED

## 2018-10-22 ENCOUNTER — Ambulatory Visit (HOSPITAL_COMMUNITY): Payer: Medicare Other

## 2018-10-22 ENCOUNTER — Ambulatory Visit (HOSPITAL_COMMUNITY)
Admission: RE | Admit: 2018-10-22 | Discharge: 2018-10-22 | Disposition: A | Discharge status: Home / Self Care | Payer: Medicare Other | Source: Ambulatory Visit | Attending: Internal Medicine | Admitting: Internal Medicine

## 2018-10-22 ENCOUNTER — Encounter (HOSPITAL_COMMUNITY): Payer: Self-pay

## 2018-10-22 DIAGNOSIS — R928 Other abnormal and inconclusive findings on diagnostic imaging of breast: Secondary | ICD-10-CM | POA: Diagnosis not present

## 2019-03-19 DIAGNOSIS — Z1283 Encounter for screening for malignant neoplasm of skin: Secondary | ICD-10-CM | POA: Diagnosis not present

## 2019-03-19 DIAGNOSIS — L820 Inflamed seborrheic keratosis: Secondary | ICD-10-CM | POA: Diagnosis not present

## 2019-03-19 DIAGNOSIS — D225 Melanocytic nevi of trunk: Secondary | ICD-10-CM | POA: Diagnosis not present

## 2019-05-19 DIAGNOSIS — N183 Chronic kidney disease, stage 3 (moderate): Secondary | ICD-10-CM | POA: Diagnosis not present

## 2019-05-19 DIAGNOSIS — Z79899 Other long term (current) drug therapy: Secondary | ICD-10-CM | POA: Diagnosis not present

## 2019-05-19 DIAGNOSIS — R030 Elevated blood-pressure reading, without diagnosis of hypertension: Secondary | ICD-10-CM | POA: Diagnosis not present

## 2019-05-26 DIAGNOSIS — G9009 Other idiopathic peripheral autonomic neuropathy: Secondary | ICD-10-CM | POA: Diagnosis not present

## 2019-05-26 DIAGNOSIS — N183 Chronic kidney disease, stage 3 (moderate): Secondary | ICD-10-CM | POA: Diagnosis not present

## 2019-05-27 DIAGNOSIS — Z23 Encounter for immunization: Secondary | ICD-10-CM | POA: Diagnosis not present

## 2019-07-17 DIAGNOSIS — R569 Unspecified convulsions: Secondary | ICD-10-CM | POA: Diagnosis not present

## 2019-07-17 DIAGNOSIS — G629 Polyneuropathy, unspecified: Secondary | ICD-10-CM | POA: Diagnosis not present

## 2019-08-29 ENCOUNTER — Encounter (HOSPITAL_COMMUNITY): Payer: Self-pay | Admitting: *Deleted

## 2019-08-29 ENCOUNTER — Emergency Department (HOSPITAL_COMMUNITY)
Admission: EM | Admit: 2019-08-29 | Discharge: 2019-08-29 | Disposition: A | Discharge status: Home / Self Care | Payer: Medicare Other | Attending: Emergency Medicine | Admitting: Emergency Medicine

## 2019-08-29 ENCOUNTER — Other Ambulatory Visit: Payer: Self-pay

## 2019-08-29 DIAGNOSIS — M436 Torticollis: Secondary | ICD-10-CM | POA: Diagnosis not present

## 2019-08-29 DIAGNOSIS — M542 Cervicalgia: Secondary | ICD-10-CM | POA: Diagnosis present

## 2019-08-29 MED ORDER — KETOROLAC TROMETHAMINE 60 MG/2ML IM SOLN
60.0000 mg | Freq: Once | INTRAMUSCULAR | Status: AC
  Administered 2019-08-29: 60 mg via INTRAMUSCULAR
  Filled 2019-08-29: qty 2

## 2019-08-29 MED ORDER — METHOCARBAMOL 500 MG PO TABS: 500.0000 mg | ORAL_TABLET | Freq: Two times a day (BID) | ORAL | 0 refills | Status: DC | PRN

## 2019-08-29 MED ORDER — METHOCARBAMOL 500 MG PO TABS
1000.0000 mg | ORAL_TABLET | Freq: Once | ORAL | Status: AC
  Administered 2019-08-29: 1000 mg via ORAL
  Filled 2019-08-29: qty 2

## 2019-08-29 MED ORDER — NAPROXEN 500 MG PO TABS: 500.0000 mg | ORAL_TABLET | Freq: Two times a day (BID) | ORAL | 0 refills | Status: DC

## 2019-08-29 NOTE — ED Notes (Signed)
Unable to turn head from side to side. Worse during the night

## 2019-08-29 NOTE — Discharge Instructions (Signed)
Please read the attached instructions.,  You should use warm compresses intermittently, you may take naproxen twice daily and Robaxin as prescribed to help with muscle spasms.  Emergency department evaluation for increasing pain, numbness, weakness, swelling, redness or any other severe or worsening symptoms

## 2019-08-29 NOTE — ED Triage Notes (Signed)
Patient presents to the ED with neck stiffness for one day.  Patient unable to turn head left or right.  Patient also reports a headache.  No nausea, vomiting or fever.
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
## 2019-09-09 ENCOUNTER — Other Ambulatory Visit (HOSPITAL_COMMUNITY): Payer: Self-pay | Admitting: Internal Medicine

## 2019-09-09 DIAGNOSIS — Z1231 Encounter for screening mammogram for malignant neoplasm of breast: Secondary | ICD-10-CM

## 2019-10-27 ENCOUNTER — Other Ambulatory Visit: Payer: Self-pay

## 2019-10-27 ENCOUNTER — Ambulatory Visit (HOSPITAL_COMMUNITY)
Admission: RE | Admit: 2019-10-27 | Discharge: 2019-10-27 | Disposition: A | Discharge status: Home / Self Care | Payer: Medicare Other | Source: Ambulatory Visit | Attending: Internal Medicine | Admitting: Internal Medicine

## 2019-10-27 DIAGNOSIS — Z1231 Encounter for screening mammogram for malignant neoplasm of breast: Secondary | ICD-10-CM | POA: Diagnosis not present

## 2019-11-03 DIAGNOSIS — R768 Other specified abnormal immunological findings in serum: Secondary | ICD-10-CM | POA: Diagnosis not present

## 2019-12-04 DIAGNOSIS — H2513 Age-related nuclear cataract, bilateral: Secondary | ICD-10-CM | POA: Diagnosis not present

## 2019-12-04 DIAGNOSIS — H524 Presbyopia: Secondary | ICD-10-CM | POA: Diagnosis not present

## 2020-04-05 ENCOUNTER — Ambulatory Visit (INDEPENDENT_AMBULATORY_CARE_PROVIDER_SITE_OTHER): Payer: Medicare Other | Admitting: Adult Health

## 2020-04-05 ENCOUNTER — Encounter: Payer: Self-pay | Admitting: Adult Health

## 2020-04-05 VITALS — BP 131/80 | HR 79 | Ht 66.0 in | Wt 255.2 lb

## 2020-04-05 DIAGNOSIS — Z01419 Encounter for gynecological examination (general) (routine) without abnormal findings: Secondary | ICD-10-CM | POA: Diagnosis not present

## 2020-04-05 DIAGNOSIS — Z1211 Encounter for screening for malignant neoplasm of colon: Secondary | ICD-10-CM | POA: Diagnosis not present

## 2020-04-05 LAB — HEMOCCULT GUIAC POC 1CARD (OFFICE): Fecal Occult Blood, POC: NEGATIVE

## 2020-04-05 NOTE — Progress Notes (Signed)
Patient ID: [name], female   DOB: 10-12-1965, 54 y.o.   MRN: [id number] History of Present Illness:  [name] is a 54 year old white female,married, PM in for a well woman gyn exam, she had a normal pap with negative HPV 10/01/2018. She has had COVID vaccine and has appt next week with Dr Willey Blade.  PCP is Dr Willey Blade.  Current Medications, Allergies, Past Medical History, Past Surgical History, Family History and Social History were reviewed in [employer] record.     Review of Systems:  Patient denies any headaches, hearing loss, fatigue, blurred vision, shortness of breath, chest pain, abdominal pain, problems with bowel movements(no BM in 3 days, no abdominal pain), urination, or intercourse(not active). No joint pain or mood swings. No vaginal bleeding.  Physical Exam:BP (!) 131/80 (BP Location: Right Wrist, Patient Position: Sitting, Cuff Size: Normal)   Pulse 79   Ht 5\' 6"  (1.676 m)   Wt (!) 255 lb 3.2 oz (115.8 kg)   LMP 07/22/2018 (Approximate)   BMI 41.19 kg/m  General:  Well developed, well nourished, no acute distress Skin:  Warm and dry Neck:  Midline trachea, normal thyroid, good ROM, no lymphadenopathy Lungs; Clear to auscultation bilaterally Breast:  No dominant palpable mass, retraction, or nipple discharge Cardiovascular: Regular rate and rhythm Abdomen:  Soft, non tender, no hepatosplenomegaly Pelvic:  External genitalia is normal in appearance, no lesions.  The vagina is pale with loss of moisture and rugae. Urethra has no lesions or masses. The cervix is smooth.  Uterus is felt to be normal size, shape, and contour.  No adnexal masses or tenderness noted.Bladder is non tender, no masses felt. Rectal: Good sphincter tone, no polyps, or hemorrhoids felt.  Hemoccult negative. Extremities/musculoskeletal:  No big varicosities noted,has spider veins in LE, no clubbing or cyanosis, has swelling in left arm from where arm reattached after near amputation  years ago. Psych:  No mood changes, alert and cooperative,seems happy AA is 0 Fall risk is low PHQ 9 score is 0.  Upstream - 04/05/20 0841      Pregnancy Intention Screening   Does the patient want to become pregnant in the next year? N/A    Does the patient's partner want to become pregnant in the next year? N/A    Would the patient like to discuss contraceptive options today? N/A      Contraception Wrap Up   Current Method No Method - Other Reason   PM   End Method No Method - Other Reason   PM   Contraception Counseling Provided No         Examination chaperoned by Diona Fanti CM.  Impression and Plan:  1. Encounter for well woman exam with routine gynecological exam Physical in 1 year Pap in 2023 Mammogram yearly Labs with PCP Colonoscopy per GI   2. Encounter for screening fecal occult blood testing

## 2020-05-20 DIAGNOSIS — Z79899 Other long term (current) drug therapy: Secondary | ICD-10-CM | POA: Diagnosis not present

## 2020-05-20 DIAGNOSIS — N183 Chronic kidney disease, stage 3 unspecified: Secondary | ICD-10-CM | POA: Diagnosis not present

## 2020-05-27 DIAGNOSIS — N183 Chronic kidney disease, stage 3 unspecified: Secondary | ICD-10-CM | POA: Diagnosis not present

## 2020-05-27 DIAGNOSIS — I499 Cardiac arrhythmia, unspecified: Secondary | ICD-10-CM | POA: Diagnosis not present

## 2020-05-27 DIAGNOSIS — Z0001 Encounter for general adult medical examination with abnormal findings: Secondary | ICD-10-CM | POA: Diagnosis not present

## 2020-05-27 DIAGNOSIS — Z23 Encounter for immunization: Secondary | ICD-10-CM | POA: Diagnosis not present

## 2020-07-19 DIAGNOSIS — R569 Unspecified convulsions: Secondary | ICD-10-CM | POA: Diagnosis not present

## 2020-07-19 DIAGNOSIS — G629 Polyneuropathy, unspecified: Secondary | ICD-10-CM | POA: Insufficient documentation

## 2020-07-19 DIAGNOSIS — G40009 Localization-related (focal) (partial) idiopathic epilepsy and epileptic syndromes with seizures of localized onset, not intractable, without status epilepticus: Secondary | ICD-10-CM | POA: Insufficient documentation

## 2020-10-29 ENCOUNTER — Other Ambulatory Visit (HOSPITAL_COMMUNITY): Payer: Self-pay | Admitting: Internal Medicine

## 2020-10-29 DIAGNOSIS — Z1231 Encounter for screening mammogram for malignant neoplasm of breast: Secondary | ICD-10-CM

## 2020-11-17 ENCOUNTER — Ambulatory Visit (HOSPITAL_COMMUNITY): Payer: Medicare Other

## 2020-11-22 ENCOUNTER — Ambulatory Visit (HOSPITAL_COMMUNITY): Payer: Medicare Other

## 2020-12-02 ENCOUNTER — Other Ambulatory Visit: Payer: Self-pay

## 2020-12-02 ENCOUNTER — Ambulatory Visit (HOSPITAL_COMMUNITY)
Admission: RE | Admit: 2020-12-02 | Discharge: 2020-12-02 | Disposition: A | Discharge status: Home / Self Care | Payer: Medicare Other | Source: Ambulatory Visit | Attending: Internal Medicine | Admitting: Internal Medicine

## 2020-12-02 DIAGNOSIS — Z1231 Encounter for screening mammogram for malignant neoplasm of breast: Secondary | ICD-10-CM | POA: Diagnosis not present

## 2020-12-09 DIAGNOSIS — H524 Presbyopia: Secondary | ICD-10-CM | POA: Diagnosis not present

## 2020-12-09 DIAGNOSIS — R519 Headache, unspecified: Secondary | ICD-10-CM | POA: Diagnosis not present

## 2021-03-10 ENCOUNTER — Encounter: Payer: Medicare Other | Admitting: Adult Health

## 2021-03-10 ENCOUNTER — Encounter: Payer: Self-pay | Admitting: Adult Health

## 2021-03-10 ENCOUNTER — Other Ambulatory Visit: Payer: Self-pay

## 2021-03-11 NOTE — Progress Notes (Signed)
This encounter was created in error - please disregard.

## 2021-04-21 ENCOUNTER — Other Ambulatory Visit: Payer: Self-pay

## 2021-04-21 ENCOUNTER — Encounter: Payer: Self-pay | Admitting: Adult Health

## 2021-04-21 ENCOUNTER — Ambulatory Visit (INDEPENDENT_AMBULATORY_CARE_PROVIDER_SITE_OTHER): Payer: Medicare Other | Admitting: Adult Health

## 2021-04-21 VITALS — BP 136/82 | HR 78 | Ht 65.0 in | Wt 260.0 lb

## 2021-04-21 DIAGNOSIS — Z01419 Encounter for gynecological examination (general) (routine) without abnormal findings: Secondary | ICD-10-CM | POA: Diagnosis not present

## 2021-04-21 DIAGNOSIS — Z1211 Encounter for screening for malignant neoplasm of colon: Secondary | ICD-10-CM | POA: Diagnosis not present

## 2021-04-21 LAB — HEMOCCULT GUIAC POC 1CARD (OFFICE): Fecal Occult Blood, POC: NEGATIVE

## 2021-04-21 NOTE — Progress Notes (Signed)
Patient ID: Stacey Ford, female   DOB: 11/24/1965, 55 y.o.   MRN: [id number] History of Present Illness: Stacey Ford is a 55 year old white female,married, G0P0, in for a well woman gyn exam. Lab Results  Component Value Date   DIAGPAP  10/01/2018    NEGATIVE FOR INTRAEPITHELIAL LESIONS OR MALIGNANCY.   HPV NOT DETECTED 10/01/2018   PCP is Dr Willey Blade.   Current Medications, Allergies, Past Medical History, Past Surgical History, Family History and Social History were reviewed in [employer] record.     Review of Systems: Patient denies any headaches, hearing loss, fatigue, blurred vision, shortness of breath, chest pain, abdominal pain, problems with bowel movements, urination, or intercourse. (Not active).No joint pain or mood swings.  Denies any vaginal bleeding. +hot flashes   Physical Exam:BP 136/82 (BP Location: Right Arm, Patient Position: Sitting, Cuff Size: Large)   Pulse 78   Ht '5\' 5"'$  (1.651 m)   Wt 260 lb (117.9 kg)   LMP 07/22/2018 (Approximate)   BMI 43.27 kg/m   General:  Well developed, well nourished, no acute distress Skin:  Warm and dry Neck:  Midline trachea, normal thyroid, good ROM, no lymphadenopathy Lungs; Clear to auscultation bilaterally Breast:  No dominant palpable mass, retraction, or nipple discharge Cardiovascular: Regular rate and rhythm Abdomen:  Soft, non tender, no hepatosplenomegaly Pelvic:  External genitalia is normal in appearance, no lesions.  The vagina is pale with loss of moisture and rugae. Urethra has no lesions or masses. The cervix is smooth.Uterus is felt to be normal size, shape, and contour.  No adnexal masses or tenderness noted.Bladder is non tender, no masses felt. Rectal: Good sphincter tone, no polyps, or hemorrhoids felt.  Hemoccult negative. Extremities/musculoskeletal:  No swelling or varicosities noted, no clubbing or cyanosis Psych:  No mood changes, alert and cooperative,seems happy AA is 0  Fall risk is  low Depression screen [facility] 2/9 04/21/2021 03/10/2021 04/05/2020  Decreased Interest 0 0 0  Down, Depressed, Hopeless 0 0 0  PHQ - 2 Score 0 0 0  Altered sleeping 0 0 0  Tired, decreased energy 0 0 0  Change in appetite 0 0 0  Feeling bad or failure about yourself  0 0 0  Trouble concentrating 0 0 0  Moving slowly or fidgety/restless 0 0 0  Suicidal thoughts 0 0 0  PHQ-9 Score 0 0 0  Difficult doing work/chores - - Not difficult at all    GAD 7 : Generalized Anxiety Score 04/21/2021 03/10/2021 04/05/2020  Nervous, Anxious, on Edge 0 0 0  Control/stop worrying 0 0 0  Worry too much - different things 0 0 0  Trouble relaxing 0 0 0  Restless 0 0 0  Easily annoyed or irritable 0 0 0  Afraid - awful might happen 0 0 0  Total GAD 7 Score 0 0 0  Anxiety Difficulty - - Not difficult at all      Upstream - 04/21/21 1047       Pregnancy Intention Screening   Does the patient want to become pregnant in the next year? No    Does the patient's partner want to become pregnant in the next year? No    Would the patient like to discuss contraceptive options today? No      Contraception Wrap Up   Current Method Abstinence    End Method Abstinence    Contraception Counseling Provided No  Examination chaperoned by Levy Pupa LPN  Impression and Plan: 1. Encounter for well woman exam with routine gynecological exam Pap and physical in 1 year Mammogram yearly Colonoscopy per GI Labs with PCP  2. Encounter for screening fecal occult blood testing

## 2021-05-26 DIAGNOSIS — R7301 Impaired fasting glucose: Secondary | ICD-10-CM | POA: Diagnosis not present

## 2021-05-26 DIAGNOSIS — N183 Chronic kidney disease, stage 3 unspecified: Secondary | ICD-10-CM | POA: Diagnosis not present

## 2021-05-26 DIAGNOSIS — Z79899 Other long term (current) drug therapy: Secondary | ICD-10-CM | POA: Diagnosis not present

## 2021-06-02 DIAGNOSIS — Z23 Encounter for immunization: Secondary | ICD-10-CM | POA: Diagnosis not present

## 2021-06-02 DIAGNOSIS — N1831 Chronic kidney disease, stage 3a: Secondary | ICD-10-CM | POA: Diagnosis not present

## 2021-06-02 DIAGNOSIS — E785 Hyperlipidemia, unspecified: Secondary | ICD-10-CM | POA: Diagnosis not present

## 2021-06-02 DIAGNOSIS — Z0000 Encounter for general adult medical examination without abnormal findings: Secondary | ICD-10-CM | POA: Diagnosis not present

## 2021-06-20 ENCOUNTER — Other Ambulatory Visit: Payer: Self-pay | Admitting: Podiatry

## 2021-06-20 ENCOUNTER — Other Ambulatory Visit: Payer: Self-pay

## 2021-06-20 ENCOUNTER — Ambulatory Visit (INDEPENDENT_AMBULATORY_CARE_PROVIDER_SITE_OTHER): Payer: Medicare Other

## 2021-06-20 ENCOUNTER — Encounter: Payer: Self-pay | Admitting: Podiatry

## 2021-06-20 ENCOUNTER — Ambulatory Visit: Payer: Medicare Other | Admitting: Podiatry

## 2021-06-20 DIAGNOSIS — M7752 Other enthesopathy of left foot: Secondary | ICD-10-CM | POA: Diagnosis not present

## 2021-06-20 DIAGNOSIS — M7672 Peroneal tendinitis, left leg: Secondary | ICD-10-CM

## 2021-06-20 MED ORDER — MELOXICAM 15 MG PO TABS: 15.0000 mg | ORAL_TABLET | Freq: Every day | ORAL | 0 refills | Status: DC

## 2021-06-20 NOTE — Patient Instructions (Signed)
Peroneal Tendinopathy Rehab Ask your health care provider which exercises are safe for you. Do exercises exactly as told by your health care provider and adjust them as directed. It is normal to feel mild stretching, pulling, tightness, or discomfort as you do these exercises. Stop right away if you feel sudden pain or your pain gets worse. Do not begin these exercises until told by your health care provider. Stretching and range-of-motion exercises These exercises warm up your muscles and joints and improve the movement and flexibility of your ankle. These exercises also help to relieve pain and stiffness. Gastroc and soleus stretch, standing This is an exercise in which you stand on a step and use your body weight to stretch your calf muscles. To do this exercise: Stand on the edge of a step on the ball of your left / right foot. The ball of your foot is on the walking surface, right under your toes. Keep your other foot firmly on the same step. Hold on to the wall, a railing, or a chair for balance. Slowly lift your other foot, allowing your body weight to press your left / right heel down over the edge of the step. You should feel a stretch in your left / right calf (gastrocnemius and soleus). Hold this position for __________ seconds. Return both feet to the step. Repeat this exercise with a slight bend in your left / right knee. Repeat __________ times with your left / right knee straight and __________ times with your left / right knee bent. Complete this exercise __________ times a day. Strengthening exercises These exercises build strength and endurance in your foot and ankle. Endurance is the ability to use your muscles for a long time, even after they get tired. Ankle dorsiflexion with band  Secure a rubber exercise band or tube to an object, such as a table leg, that will not move when the band is pulled. Secure the other end of the band around your left / right foot. Sit on the  floor, facing the object with your left / right leg extended. The band or tube should be slightly tense when your foot is relaxed. Slowly flex your left / right ankle and toes to bring your foot toward you (dorsiflexion). Hold this position for __________ seconds. Let the band or tube slowly pull your foot back to the starting position. Repeat __________ times. Complete this exercise __________ times a day. Ankle eversion Sit on the floor with your legs straight out in front of you. Loop a rubber exercise band or tube around the ball of your left / right foot. The ball of your foot is on the walking surface, right under your toes. Hold the ends of the band in your hands, or secure the band to a stable object. The band or tube should be slightly tense when your foot is relaxed. Slowly push your foot outward, away from your other leg (eversion). Hold this position for __________ seconds. Slowly return your foot to the starting position. Repeat __________ times. Complete this exercise __________ times a day. Plantar flexion, standing This exercise is sometimes called standing heel raise. Stand with your feet shoulder-width apart. Place your hands on a wall or table to steady yourself as needed, but try not to use it for support. Keep your weight spread evenly over the width of your feet while you slowly rise up on your toes (plantar flexion). If told by your health care provider: Shift your weight toward your left / right   leg until you feel challenged. Stand on your left / right leg only. Hold this position for __________ seconds. Repeat __________ times. Complete this exercise __________ times a day. Single leg stand Without shoes, stand near a railing or in a doorway. You may hold on to the railing or door frame as needed. Stand on your left / right foot. Keep your big toe down on the floor and try to keep your arch lifted. Do not roll to the outside of your foot. If this exercise is too  easy, you can try it with your eyes closed or while standing on a pillow. Hold this position for __________ seconds. Repeat __________ times. Complete this exercise __________ times a day. This information is not intended to replace advice given to you by your health care provider. Make sure you discuss any questions you have with your health care provider. Document Revised: 12/10/2018 Document Reviewed: 12/10/2018 Elsevier Patient Education  [place].
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
## 2021-06-29 DIAGNOSIS — L570 Actinic keratosis: Secondary | ICD-10-CM | POA: Diagnosis not present

## 2021-07-21 DIAGNOSIS — R4189 Other symptoms and signs involving cognitive functions and awareness: Secondary | ICD-10-CM | POA: Insufficient documentation

## 2021-07-21 DIAGNOSIS — R569 Unspecified convulsions: Secondary | ICD-10-CM | POA: Diagnosis not present

## 2021-08-01 ENCOUNTER — Other Ambulatory Visit: Payer: Self-pay

## 2021-08-01 ENCOUNTER — Encounter: Payer: Self-pay | Admitting: Podiatry

## 2021-08-01 ENCOUNTER — Ambulatory Visit: Payer: Medicare Other | Admitting: Podiatry

## 2021-08-01 DIAGNOSIS — M7672 Peroneal tendinitis, left leg: Secondary | ICD-10-CM | POA: Diagnosis not present
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
## 2021-08-02 ENCOUNTER — Encounter: Payer: Self-pay | Admitting: Podiatry

## 2021-08-03 NOTE — Telephone Encounter (Signed)
Please advise 

## 2021-08-05 NOTE — Telephone Encounter (Signed)
Please advise 

## 2021-10-19 ENCOUNTER — Other Ambulatory Visit (HOSPITAL_COMMUNITY): Payer: Self-pay | Admitting: Internal Medicine

## 2021-10-19 DIAGNOSIS — Z1231 Encounter for screening mammogram for malignant neoplasm of breast: Secondary | ICD-10-CM

## 2021-11-01 DIAGNOSIS — N179 Acute kidney failure, unspecified: Secondary | ICD-10-CM | POA: Diagnosis not present

## 2021-11-01 DIAGNOSIS — M5080 Other cervical disc disorders, unspecified cervical region: Secondary | ICD-10-CM | POA: Diagnosis not present

## 2021-12-05 ENCOUNTER — Ambulatory Visit (HOSPITAL_COMMUNITY)
Admission: RE | Admit: 2021-12-05 | Discharge: 2021-12-05 | Disposition: A | Discharge status: Home / Self Care | Payer: Medicare Other | Source: Ambulatory Visit | Attending: Internal Medicine | Admitting: Internal Medicine

## 2021-12-05 DIAGNOSIS — Z1231 Encounter for screening mammogram for malignant neoplasm of breast: Secondary | ICD-10-CM | POA: Diagnosis not present

## 2021-12-22 DIAGNOSIS — H524 Presbyopia: Secondary | ICD-10-CM | POA: Diagnosis not present

## 2021-12-22 DIAGNOSIS — H02882 Meibomian gland dysfunction right lower eyelid: Secondary | ICD-10-CM | POA: Diagnosis not present

## 2022-04-24 ENCOUNTER — Encounter: Payer: Self-pay | Admitting: Adult Health

## 2022-04-24 ENCOUNTER — Ambulatory Visit (INDEPENDENT_AMBULATORY_CARE_PROVIDER_SITE_OTHER): Payer: Medicare Other | Admitting: Adult Health

## 2022-04-24 ENCOUNTER — Other Ambulatory Visit (HOSPITAL_COMMUNITY)
Admission: RE | Admit: 2022-04-24 | Discharge: 2022-04-24 | Disposition: A | Discharge status: Home / Self Care | Payer: Medicare Other | Source: Ambulatory Visit | Attending: Adult Health | Admitting: Adult Health

## 2022-04-24 VITALS — BP 150/92 | HR 67 | Ht 65.25 in | Wt 245.5 lb

## 2022-04-24 DIAGNOSIS — Z01419 Encounter for gynecological examination (general) (routine) without abnormal findings: Secondary | ICD-10-CM | POA: Insufficient documentation

## 2022-04-24 DIAGNOSIS — R232 Flushing: Secondary | ICD-10-CM | POA: Insufficient documentation

## 2022-04-24 DIAGNOSIS — Z1211 Encounter for screening for malignant neoplasm of colon: Secondary | ICD-10-CM

## 2022-04-24 DIAGNOSIS — Z780 Asymptomatic menopausal state: Secondary | ICD-10-CM | POA: Insufficient documentation

## 2022-04-24 DIAGNOSIS — Z1151 Encounter for screening for human papillomavirus (HPV): Secondary | ICD-10-CM | POA: Insufficient documentation

## 2022-04-24 DIAGNOSIS — R030 Elevated blood-pressure reading, without diagnosis of hypertension: Secondary | ICD-10-CM | POA: Diagnosis not present

## 2022-04-24 LAB — HEMOCCULT GUIAC POC 1CARD (OFFICE): Fecal Occult Blood, POC: NEGATIVE

## 2022-04-24 NOTE — Progress Notes (Signed)
Patient ID: Stacey Ford, female   DOB: December 16, 1965, 56 y.o.   MRN: [id number] History of Present Illness: Stacey Ford is a 56 year old white female,married, PM, in for well woman gyn exam and pap.  PCP is Dr Willey Blade.   Current Medications, Allergies, Past Medical History, Past Surgical History, Family History and Social History were reviewed in [employer] record.     Review of Systems: Patient denies any headaches, hearing loss, fatigue, blurred vision, shortness of breath, chest pain, abdominal pain, problems with bowel movements, urination, or intercourse.(Not having sex).  No joint pain or mood swings.  Still has hot flashes Denies any vaginal bleeding    Physical Exam:BP (!) 150/92 (BP Location: Right Arm, Cuff Size: Large)   Pulse 67   Ht 5' 5.25" (1.657 m)   Wt 245 lb 8 oz (111.4 kg)   LMP 07/22/2018 (Approximate)   BMI 40.54 kg/m   General:  Well developed, well nourished, no acute distress Skin:  Warm and dry Neck:  Midline trachea, normal thyroid, good ROM, no lymphadenopathy Lungs; Clear to auscultation bilaterally Breast:  No dominant palpable mass, retraction, or nipple discharge Cardiovascular: Regular rate and rhythm Abdomen:  Soft, non tender, no hepatosplenomegaly Pelvic:  External genitalia is normal in appearance, no lesions.  The vagina is pale with loss of rugae. Urethra has no lesions or masses. The cervix is smooth, pap with HR HPV genotyping performed.   Uterus is felt to be normal size, shape, and contour.  No adnexal masses or tenderness noted.Bladder is non tender, no masses felt. Rectal: Good sphincter tone, no polyps, or hemorrhoids felt.  Hemoccult negative. Extremities/musculoskeletal:  No swelling or varicosities noted, no clubbing or cyanosis Psych:  No mood changes, alert and cooperative,seems happy AA is 0 Fall risk is low    04/24/2022    9:31 AM 04/21/2021   10:43 AM 03/10/2021    2:35 PM  Depression screen PHQ 2/9  Decreased  Interest 0 0 0  Down, Depressed, Hopeless 0 0 0  PHQ - 2 Score 0 0 0  Altered sleeping 0 0 0  Tired, decreased energy 0 0 0  Change in appetite 0 0 0  Feeling bad or failure about yourself  0 0 0  Trouble concentrating 0 0 0  Moving slowly or fidgety/restless 0 0 0  Suicidal thoughts 0 0 0  PHQ-9 Score 0 0 0       04/24/2022    9:31 AM 04/21/2021   10:43 AM 03/10/2021    2:35 PM 04/05/2020    8:41 AM  GAD 7 : Generalized Anxiety Score  Nervous, Anxious, on Edge 0 0 0 0  Control/stop worrying 0 0 0 0  Worry too much - different things 0 0 0 0  Trouble relaxing 0 0 0 0  Restless 0 0 0 0  Easily annoyed or irritable 0 0 0 0  Afraid - awful might happen 0 0 0 0  Total GAD 7 Score 0 0 0 0  Anxiety Difficulty    Not difficult at all      Upstream - 04/24/22 0940       Pregnancy Intention Screening   Does the patient want to become pregnant in the next year? No    Does the patient's partner want to become pregnant in the next year? No    Would the patient like to discuss contraceptive options today? No      Contraception Wrap Up   Current  Method No Method - Other Reason   postmenopausal   End Method No Method - Other Reason   postmenopausal   Contraception Counseling Provided No            Examination chaperoned by Levy Pupa LPN   Impression and Plan: 1. Encounter for gynecological examination with Papanicolaou smear of cervix Pap sent Pap in 3 years if normal  Physical in 1 year Labs with PCP Mammogram normal 12/05/21. - Cytology - PAP( [place]) Colonoscopy per GI  2. Encounter for screening fecal occult blood testing Hemoccult negative   3. Elevated BP without diagnosis of hypertension Keep check on BP at home Sees PCP in September   4. Postmenopause  5. Hot flashes

## 2022-04-28 LAB — CYTOLOGY - PAP
Comment: NEGATIVE
Diagnosis: NEGATIVE
High risk HPV: NEGATIVE

## 2022-06-02 DIAGNOSIS — Z23 Encounter for immunization: Secondary | ICD-10-CM | POA: Diagnosis not present

## 2022-06-06 DIAGNOSIS — E785 Hyperlipidemia, unspecified: Secondary | ICD-10-CM | POA: Diagnosis not present

## 2022-06-06 DIAGNOSIS — R7301 Impaired fasting glucose: Secondary | ICD-10-CM | POA: Diagnosis not present

## 2022-06-06 DIAGNOSIS — N1831 Chronic kidney disease, stage 3a: Secondary | ICD-10-CM | POA: Diagnosis not present

## 2022-06-06 DIAGNOSIS — Z79899 Other long term (current) drug therapy: Secondary | ICD-10-CM | POA: Diagnosis not present

## 2022-06-13 DIAGNOSIS — R7309 Other abnormal glucose: Secondary | ICD-10-CM | POA: Diagnosis not present

## 2022-06-13 DIAGNOSIS — R001 Bradycardia, unspecified: Secondary | ICD-10-CM | POA: Diagnosis not present

## 2022-06-13 DIAGNOSIS — E785 Hyperlipidemia, unspecified: Secondary | ICD-10-CM | POA: Diagnosis not present

## 2022-06-13 DIAGNOSIS — N1831 Chronic kidney disease, stage 3a: Secondary | ICD-10-CM | POA: Diagnosis not present

## 2022-06-28 DIAGNOSIS — L570 Actinic keratosis: Secondary | ICD-10-CM | POA: Diagnosis not present

## 2022-07-24 DIAGNOSIS — H9313 Tinnitus, bilateral: Secondary | ICD-10-CM | POA: Diagnosis not present

## 2022-07-24 DIAGNOSIS — R569 Unspecified convulsions: Secondary | ICD-10-CM | POA: Diagnosis not present

## 2022-07-24 DIAGNOSIS — Z79899 Other long term (current) drug therapy: Secondary | ICD-10-CM | POA: Diagnosis not present

## 2022-09-13 DIAGNOSIS — R569 Unspecified convulsions: Secondary | ICD-10-CM | POA: Diagnosis not present

## 2022-09-13 DIAGNOSIS — Z79899 Other long term (current) drug therapy: Secondary | ICD-10-CM | POA: Diagnosis not present

## 2022-09-14 DIAGNOSIS — Z79899 Other long term (current) drug therapy: Secondary | ICD-10-CM | POA: Diagnosis not present

## 2022-09-14 DIAGNOSIS — R569 Unspecified convulsions: Secondary | ICD-10-CM | POA: Diagnosis not present

## 2022-09-18 IMAGING — MG MM DIGITAL SCREENING BILAT W/ TOMO AND CAD
8 series · 8 of 24 positions shown · non-contrast
Comparison: Previous exam(s).

CLINICAL DATA: Screening.

EXAM:
DIGITAL SCREENING BILATERAL MAMMOGRAM WITH TOMOSYNTHESIS AND CAD
TECHNIQUE: Bilateral screening digital craniocaudal and mediolateral oblique
mammograms were obtained. Bilateral screening digital breast
tomosynthesis was performed. The images were evaluated with
computer-aided detection.

[L CC synth-2D]
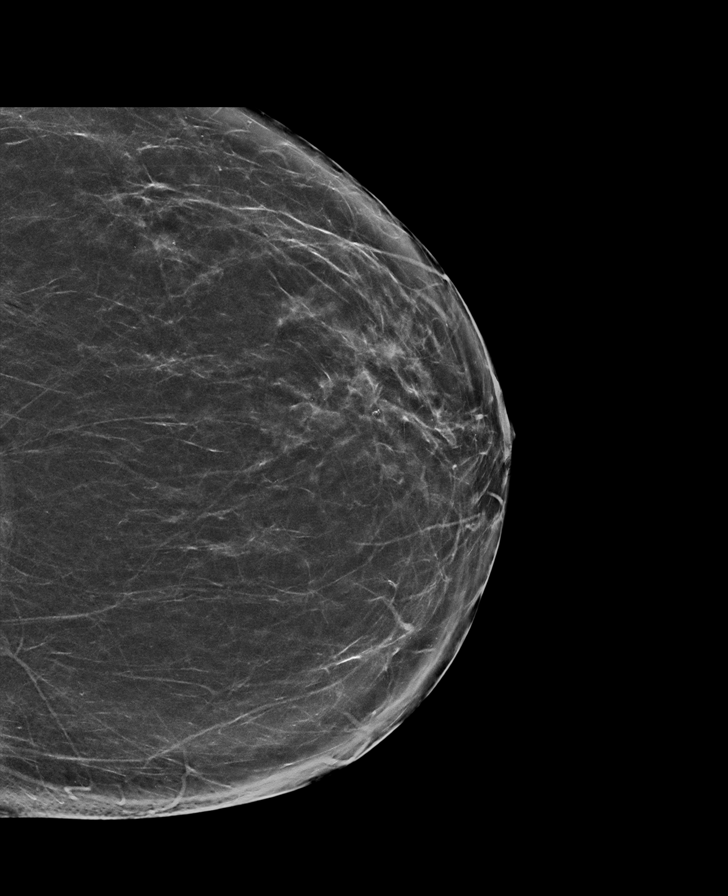

[L MLO synth-2D]
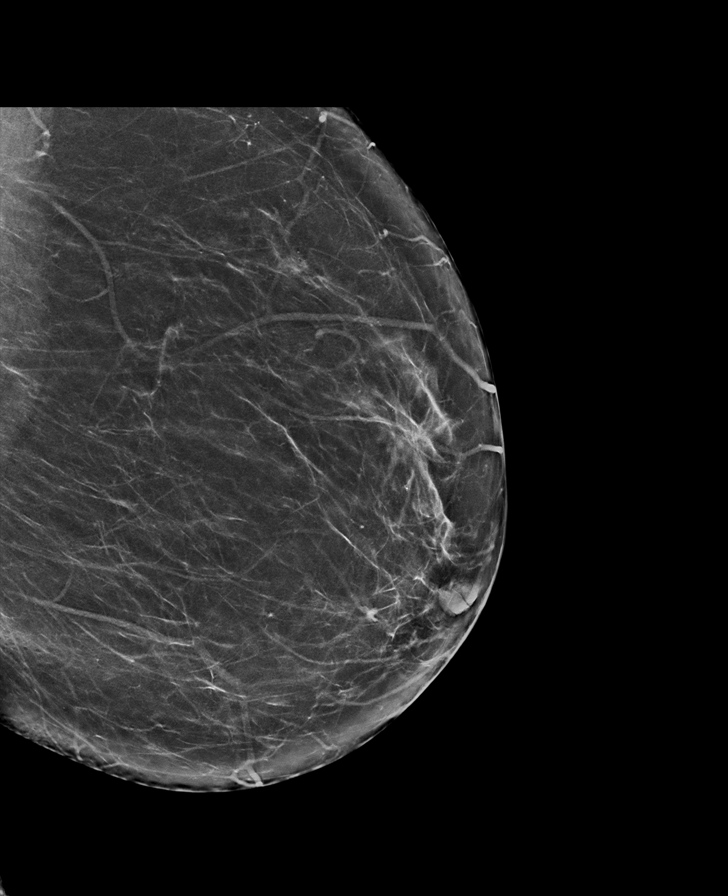

[R CC synth-2D]
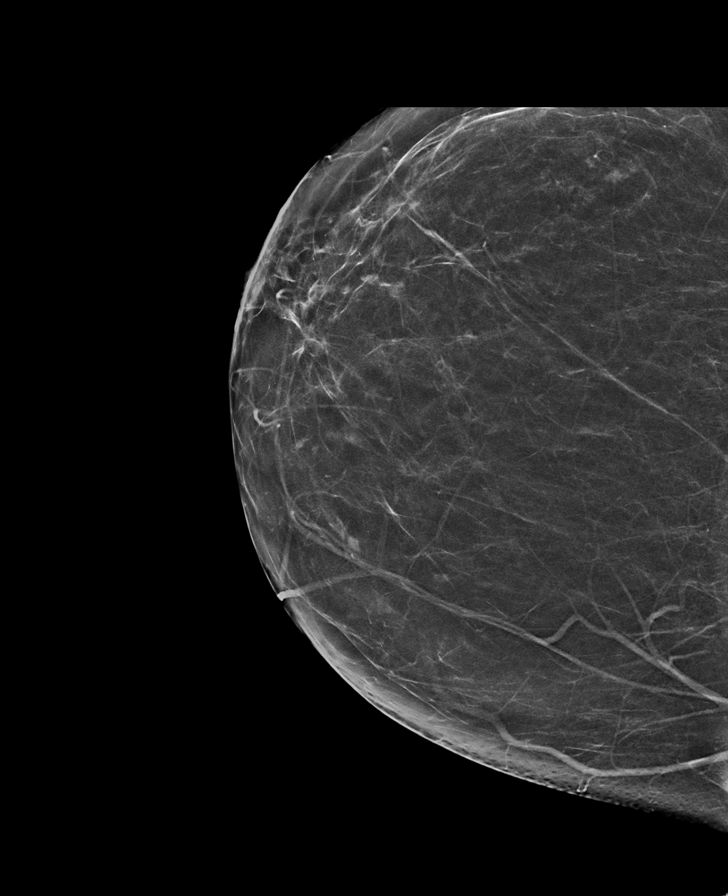

[R MLO synth-2D]
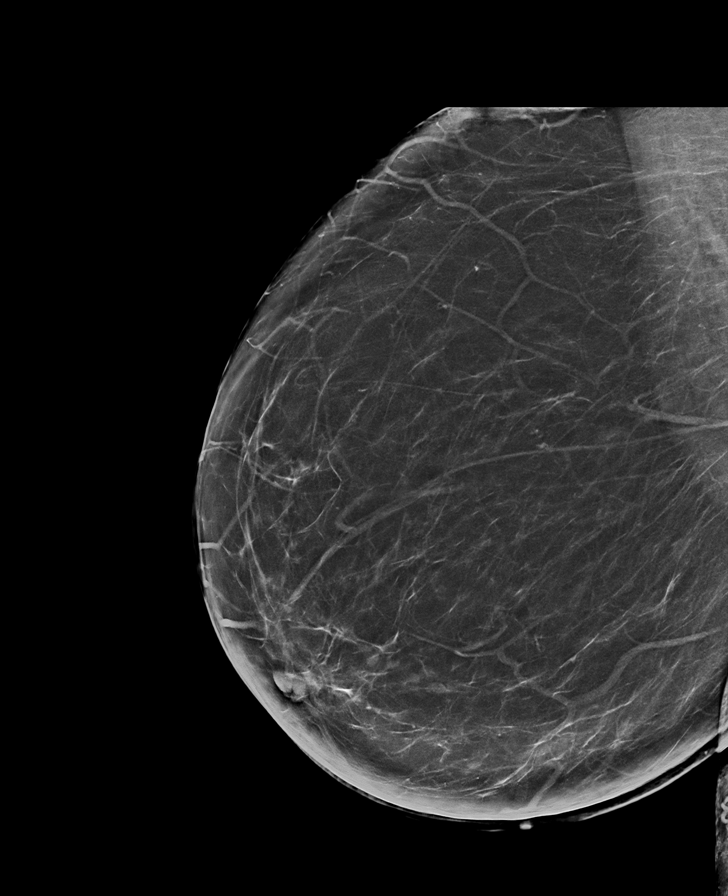

[R MLO tomo · tomo slice 41/80.0]
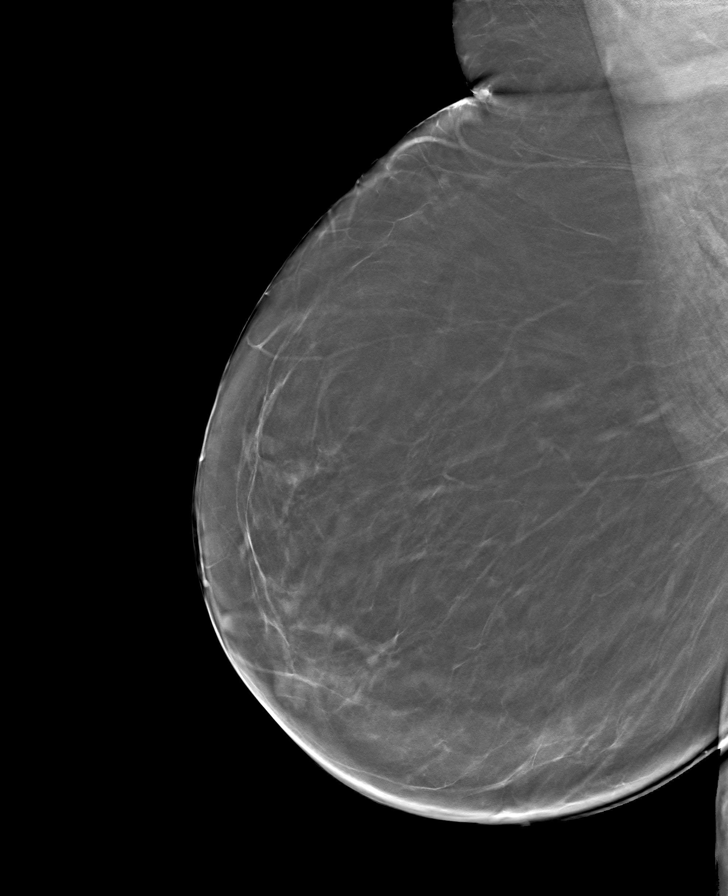

[L CC tomo · tomo slice 37/73.0]
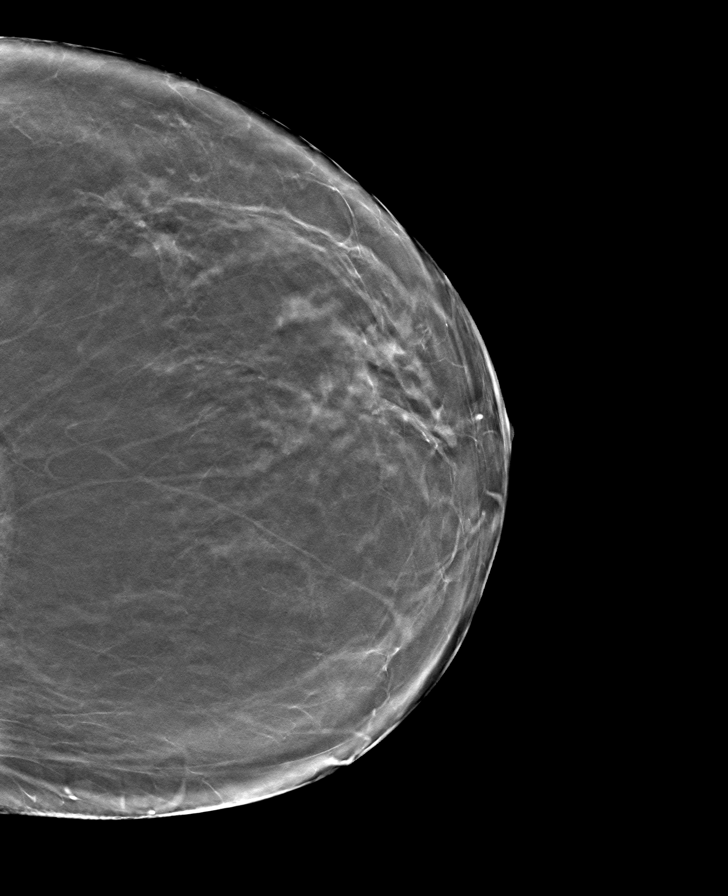

[L MLO tomo · tomo slice 39/78.0]
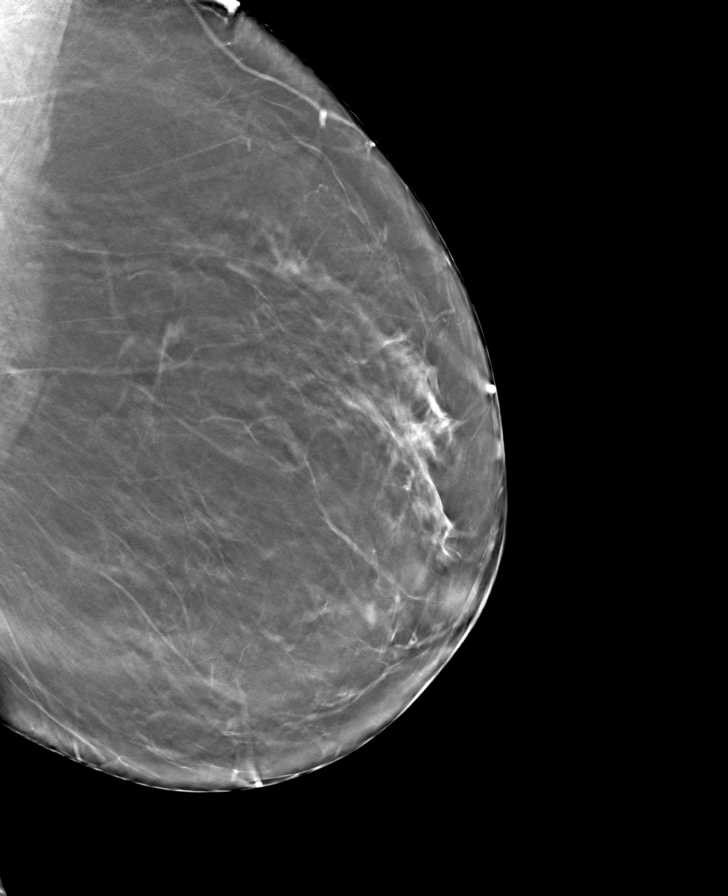

[R CC tomo · tomo slice 35/69.0]
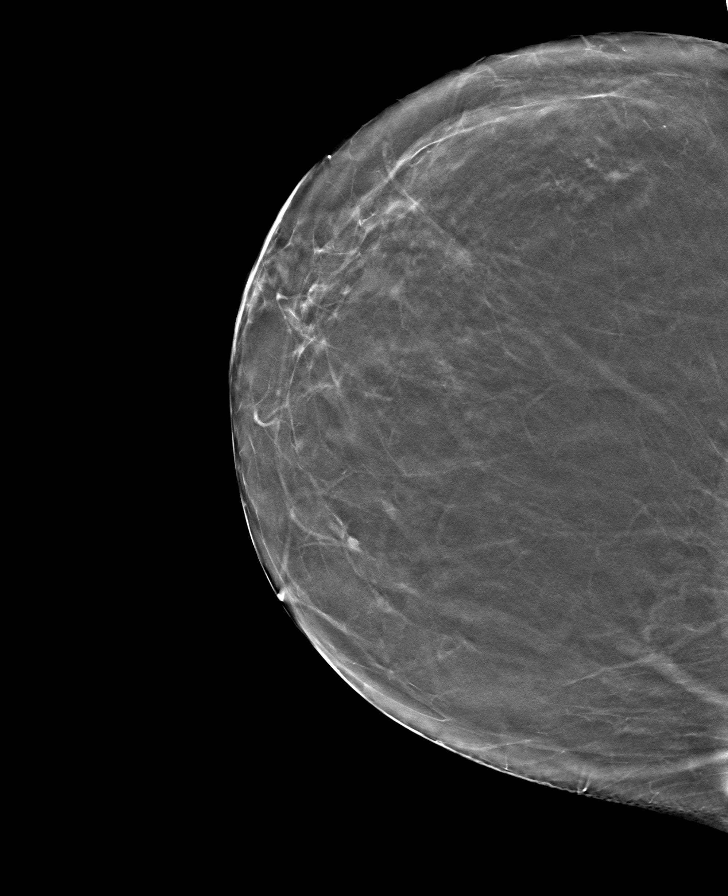

[8 of 24 positions shown; findings below may reference images not displayed]

ACR Breast Density Category b: There are scattered areas of
fibroglandular density.
FINDINGS: There are no findings suspicious for malignancy. The images were
evaluated with computer-aided detection.
IMPRESSION: No mammographic evidence of malignancy. A result letter of this
screening mammogram will be mailed directly to the patient.

RECOMMENDATION:
Screening mammogram in one year. (Code:WJ-I-BG6)

BI-RADS CATEGORY  1: Negative.

## 2022-10-04 DIAGNOSIS — Z79899 Other long term (current) drug therapy: Secondary | ICD-10-CM | POA: Diagnosis not present

## 2022-10-04 DIAGNOSIS — R569 Unspecified convulsions: Secondary | ICD-10-CM | POA: Diagnosis not present

## 2022-10-04 DIAGNOSIS — G9389 Other specified disorders of brain: Secondary | ICD-10-CM | POA: Diagnosis not present

## 2022-11-02 ENCOUNTER — Other Ambulatory Visit (HOSPITAL_COMMUNITY): Payer: Self-pay | Admitting: Internal Medicine

## 2022-11-02 DIAGNOSIS — Z1231 Encounter for screening mammogram for malignant neoplasm of breast: Secondary | ICD-10-CM

## 2022-12-11 ENCOUNTER — Ambulatory Visit (HOSPITAL_COMMUNITY)
Admission: RE | Admit: 2022-12-11 | Discharge: 2022-12-11 | Disposition: A | Discharge status: Home / Self Care | Payer: Medicare Other | Source: Ambulatory Visit | Attending: Internal Medicine | Admitting: Internal Medicine

## 2022-12-11 ENCOUNTER — Encounter (HOSPITAL_COMMUNITY): Payer: Self-pay

## 2022-12-11 DIAGNOSIS — Z1231 Encounter for screening mammogram for malignant neoplasm of breast: Secondary | ICD-10-CM | POA: Diagnosis not present

## 2022-12-22 DIAGNOSIS — H2513 Age-related nuclear cataract, bilateral: Secondary | ICD-10-CM | POA: Diagnosis not present

## 2022-12-22 DIAGNOSIS — H524 Presbyopia: Secondary | ICD-10-CM | POA: Diagnosis not present

## 2023-01-23 DIAGNOSIS — Z79899 Other long term (current) drug therapy: Secondary | ICD-10-CM | POA: Diagnosis not present

## 2023-01-23 DIAGNOSIS — R569 Unspecified convulsions: Secondary | ICD-10-CM | POA: Diagnosis not present

## 2023-03-05 ENCOUNTER — Encounter (HOSPITAL_COMMUNITY): Payer: Self-pay | Admitting: Emergency Medicine

## 2023-03-05 ENCOUNTER — Emergency Department (HOSPITAL_COMMUNITY): Payer: Medicare Other

## 2023-03-05 ENCOUNTER — Emergency Department (HOSPITAL_COMMUNITY)
Admission: EM | Admit: 2023-03-05 | Discharge: 2023-03-05 | Disposition: A | Discharge status: Home / Self Care | Payer: Medicare Other | Attending: Emergency Medicine | Admitting: Emergency Medicine

## 2023-03-05 ENCOUNTER — Other Ambulatory Visit: Payer: Self-pay

## 2023-03-05 DIAGNOSIS — G9389 Other specified disorders of brain: Secondary | ICD-10-CM | POA: Diagnosis not present

## 2023-03-05 DIAGNOSIS — R197 Diarrhea, unspecified: Secondary | ICD-10-CM | POA: Diagnosis not present

## 2023-03-05 DIAGNOSIS — W57XXXA Bitten or stung by nonvenomous insect and other nonvenomous arthropods, initial encounter: Secondary | ICD-10-CM

## 2023-03-05 DIAGNOSIS — R944 Abnormal results of kidney function studies: Secondary | ICD-10-CM | POA: Insufficient documentation

## 2023-03-05 DIAGNOSIS — R142 Eructation: Secondary | ICD-10-CM | POA: Insufficient documentation

## 2023-03-05 DIAGNOSIS — R42 Dizziness and giddiness: Secondary | ICD-10-CM | POA: Insufficient documentation

## 2023-03-05 DIAGNOSIS — S70262A Insect bite (nonvenomous), left hip, initial encounter: Secondary | ICD-10-CM | POA: Diagnosis not present

## 2023-03-05 LAB — BASIC METABOLIC PANEL
Anion gap: 7 (ref 5–15)
BUN: 23 mg/dL — ABNORMAL HIGH (ref 6–20)
CO2: 25 mmol/L (ref 22–32)
Calcium: 8.9 mg/dL (ref 8.9–10.3)
Chloride: 104 mmol/L (ref 98–111)
Creatinine, Ser: 1.37 mg/dL — ABNORMAL HIGH (ref 0.44–1.00)
GFR, Estimated: 45 mL/min — ABNORMAL LOW (ref >60)
Glucose, Bld: 112 mg/dL — ABNORMAL HIGH (ref 70–99)
Potassium: 3.8 mmol/L (ref 3.5–5.1)
Sodium: 136 mmol/L (ref 135–145)

## 2023-03-05 LAB — URINALYSIS, ROUTINE W REFLEX MICROSCOPIC
Bacteria, UA: NONE SEEN
Bilirubin Urine: NEGATIVE
Glucose, UA: NEGATIVE mg/dL
Ketones, ur: 5 mg/dL — AB
Nitrite: NEGATIVE
Protein, ur: 100 mg/dL — AB
Specific Gravity, Urine: 1.029 (ref 1.005–1.030)
pH: 5 (ref 5.0–8.0)

## 2023-03-05 LAB — CBC
HCT: 46.5 % — ABNORMAL HIGH (ref 36.0–46.0)
Hemoglobin: 15.4 g/dL — ABNORMAL HIGH (ref 12.0–15.0)
MCH: 29.2 pg (ref 26.0–34.0)
MCHC: 33.1 g/dL (ref 30.0–36.0)
MCV: 88.1 fL (ref 80.0–100.0)
Platelets: 81 10*3/uL — ABNORMAL LOW (ref 150–400)
RBC: 5.28 MIL/uL — ABNORMAL HIGH (ref 3.87–5.11)
RDW: 12.5 % (ref 11.5–15.5)
WBC: 2.5 10*3/uL — ABNORMAL LOW (ref 4.0–10.5)
nRBC: 0 % (ref 0.0–0.2)

## 2023-03-05 LAB — HEPATIC FUNCTION PANEL
ALT: 44 U/L (ref 0–44)
AST: 57 U/L — ABNORMAL HIGH (ref 15–41)
Albumin: 4.1 g/dL (ref 3.5–5.0)
Alkaline Phosphatase: 60 U/L (ref 38–126)
Bilirubin, Direct: 0.1 mg/dL (ref 0.0–0.2)
Indirect Bilirubin: 0.6 mg/dL (ref 0.3–0.9)
Total Bilirubin: 0.7 mg/dL (ref 0.3–1.2)
Total Protein: 7.1 g/dL (ref 6.5–8.1)

## 2023-03-05 LAB — LIPASE, BLOOD: Lipase: 43 U/L (ref 11–51)

## 2023-03-05 LAB — CBG MONITORING, ED: Glucose-Capillary: 137 mg/dL — ABNORMAL HIGH (ref 70–99)

## 2023-03-05 MED ORDER — DOXYCYCLINE HYCLATE 100 MG PO CAPS: 100.0000 mg | ORAL_CAPSULE | Freq: Two times a day (BID) | ORAL | 0 refills | Status: DC

## 2023-03-05 MED ORDER — MECLIZINE HCL 12.5 MG PO TABS
25.0000 mg | ORAL_TABLET | Freq: Once | ORAL | Status: AC
  Administered 2023-03-05: 25 mg via ORAL
  Filled 2023-03-05: qty 2

## 2023-03-05 MED ORDER — MECLIZINE HCL 25 MG PO TABS: 25.0000 mg | ORAL_TABLET | Freq: Three times a day (TID) | ORAL | 0 refills | Status: DC | PRN

## 2023-03-05 NOTE — Discharge Instructions (Signed)
You have been treated here with medication to help with dizziness.  I have written a prescription for the same medication.  Also, it is important that you drink plenty of water for the next several days.  You will need to have your kidney functions rechecked in 1 week.  Your primary care provider can arrange this for you.  Also you are being prescribed antibiotics for a recent tick bite please take the medication as directed until finished.  Return to the emergency department for any new or worsening symptoms.

## 2023-03-05 NOTE — ED Provider Notes (Signed)
[place] EMERGENCY DEPARTMENT AT [facility] Provider Note   CSN: [id number] Arrival date & time: 03/05/23  1404     History  Chief Complaint  Patient presents with   Dizziness    Stacey Ford is a 57 y.o. female.  Patient complains of dizziness for the past 3 days.  Patient reports that she has had trouble getting out of the bed she is so dizzy.  Patient also complains of frequent belching.  Patient reports she feels like she has gas building up and she has to belch to get it out.  Patient's husband who is with her reports that patient had 3 episodes of diarrhea.  Patient denies any chest pain she denies any abdominal pain she has not had any fever or chills.  Patient denies any vomiting she has not had any coughing patient denies any constipation.  Patient has a past medical history of seizures.  Patient is currently on seizure medication but she has not had a seizure since she had a brain surgery 30 years ago.  The history is provided by the patient. No language interpreter was used.  Dizziness Quality:  Lightheadedness Severity:  Moderate Onset quality:  Gradual Duration:  3 days Timing:  Constant Progression:  Worsening Chronicity:  New Relieved by:  Nothing Worsened by:  Nothing Ineffective treatments:  None tried Risk factors: no anemia        Home Medications Prior to Admission medications   Medication Sig Start Date End Date Taking? Authorizing Provider  Alpha-Lipoic Acid 200 MG CAPS Take 400 mg by mouth daily.    [provider]  cyanocobalamin 1000 MCG tablet Take by mouth.    [provider]  levETIRAcetam (KEPPRA) 1000 MG tablet Take 1 tablet by mouth 2 (two) times daily. 07/21/21   [provider]      Allergies    Scopolamine, Codeine, and Penicillins    Review of Systems   Review of Systems  Neurological:  Positive for dizziness.  All other systems reviewed and are negative.   Physical Exam Updated Vital  Signs BP (!) 119/52   Pulse 74   Temp 98.8 F (37.1 C) (Oral)   Resp 19   Ht 5\' 5"  (1.651 m)   Wt 98.2 kg   LMP 07/22/2018 (Approximate)   SpO2 99%   BMI 36.01 kg/m  Physical Exam Vitals and nursing note reviewed.  Constitutional:      General: She is not in acute distress.    Appearance: She is well-developed.  HENT:     Head: Normocephalic and atraumatic.     Mouth/Throat:     Mouth: Mucous membranes are moist.  Eyes:     Conjunctiva/sclera: Conjunctivae normal.  Cardiovascular:     Rate and Rhythm: Normal rate and regular rhythm.     Heart sounds: No murmur heard. Pulmonary:     Effort: Pulmonary effort is normal. No respiratory distress.     Breath sounds: Normal breath sounds.  Abdominal:     General: Abdomen is flat.     Palpations: Abdomen is soft.     Tenderness: There is no abdominal tenderness.  Musculoskeletal:        General: No swelling.     Cervical back: Neck supple.  Skin:    General: Skin is warm and dry.     Capillary Refill: Capillary refill takes less than 2 seconds.  Neurological:     Mental Status: She is alert.  Psychiatric:  Mood and Affect: Mood normal.     ED Results / Procedures / Treatments   Labs (all labs ordered are listed, but only abnormal results are displayed) Labs Reviewed  BASIC METABOLIC PANEL - Abnormal; Notable for the following components:      Result Value   Glucose, Bld 112 (*)    BUN 23 (*)    Creatinine, Ser 1.37 (*)    GFR, Estimated 45 (*)    All other components within normal limits  CBC - Abnormal; Notable for the following components:   WBC 2.5 (*)    RBC 5.28 (*)    Hemoglobin 15.4 (*)    HCT 46.5 (*)    Platelets 81 (*)    All other components within normal limits  CBG MONITORING, ED - Abnormal; Notable for the following components:   Glucose-Capillary 137 (*)    All other components within normal limits  URINALYSIS, ROUTINE W REFLEX MICROSCOPIC  HEPATIC FUNCTION PANEL  LIPASE, BLOOD     EKG None  Radiology No results found.  Procedures Procedures    Medications Ordered in ED Medications - No data to display  ED Course/ Medical Decision Making/ A&P                             Medical Decision Making Patient complains of dizziness.  Patient reports she has difficulty walking due to dizziness.  Patient also reports she has had frequent belching.  Amount and/or Complexity of Data Reviewed Independent Historian: spouse    Details: Is here with her husband who is supportive Labs: ordered. Decision-making details documented in ED Course. Radiology: ordered.   Pt's care turned over to Tammy Triplet Pac at 7pm         Final Clinical Impression(s) / ED Diagnoses Final diagnoses:  Dizziness  Belching    Rx / DC Orders ED Discharge Orders     None         Osie Cheeks 03/05/23 Charolette Child, MD 03/07/23 1146

## 2023-03-05 NOTE — ED Triage Notes (Signed)
Pt via POV c/o dizziness intermittently x 5 days with 2 episodes of diarrhea. Pt is having a lot of indigestion and is burping constantly which is unusual. Pt has frontal headache rated 10/10 intermittent. PMH includes seizures.

## 2023-03-05 NOTE — ED Provider Notes (Signed)
   Patient signed out to me pending completion of workup  Patient here with dizziness intermittently for 5 days also endorses diarrhea 2 days ago.  Decreased appetite and excessive belching.  Workup including labs chest x-ray and CT of the head  See previous provider note for complete H&P   On my exam, patient has been given Antivert here and drinking oral fluids.  She does describe vertiginous type symptoms with sensation of movement upon standing or walking.  Her dizziness resolves when at rest.  On further history, she also notes a tick bite several weeks ago that occurred on her left hip area.  She denies any fever, rash or joint pains.  Her labs do show a leukopenia and thrombocytopenia, hepatic function unremarkable, lipase also unremarkable.  Urinalysis without obvious evidence of infection.  Chemistries show elevated BUN and serum creatinine at 1.37  No recent chemistries available for comparison.  Patient endorses feeling much better after oral fluids here and meclizine.  She is requesting discharge home.  It is possible that her symptoms are related to vertigo but I am also concerned that some of her symptoms may be related to recent tick bite.  Patient is agreeable to close outpatient follow-up with her PCP and to increase her water intake over the next several days.  She will follow-up with her PCP to have her kidney function rechecked and will treat with prescription for doxycycline and meclizine.    CT Head Wo Contrast  Result Date: 03/05/2023 CLINICAL DATA:  Central vertigo EXAM: CT HEAD WITHOUT CONTRAST TECHNIQUE: Contiguous axial images were obtained from the base of the skull through the vertex without intravenous contrast. RADIATION DOSE REDUCTION: This exam was performed according to the departmental dose-optimization program which includes automated exposure control, adjustment of the mA and/or kV according to patient size and/or use of iterative reconstruction technique.  COMPARISON:  None Available. FINDINGS: Brain: There is encephalomalacia involving the anterior pole left temporal lobe subjacent to the craniotomy flap. No acute intracranial hemorrhage or infarct. No abnormal mass effect or midline shift. No abnormal intra or extra-axial mass lesion. Ventricular size is normal. Cerebellum is unremarkable. Vascular: No hyperdense vessel or unexpected calcification. Skull: Remote left temporal craniotomy.  No acute fracture. Sinuses/Orbits: Visualized paranasal sinuses are clear. Orbits are unremarkable. Other: Mastoid air cells and middle ear cavities are clear. IMPRESSION: 1. No acute intracranial abnormality. 2. 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 Electronically Signed   By: Helyn Numbers M.D.   On: 03/05/2023 20:11   DG Chest Port 1 View  Result Date: 03/05/2023 CLINICAL DATA:  Belching and dizziness. EXAM: PORTABLE CHEST 1 VIEW COMPARISON:  Chest radiograph dated 10/16/2014. FINDINGS: The heart size and mediastinal contours are within normal limits. Both lungs are clear. The visualized skeletal structures are unremarkable. IMPRESSION: No active disease. Electronically Signed   By: Elgie Collard M.D.   On: 03/05/2023 19:45      Pauline Aus, PA-C 03/05/23 2116    Bethann Berkshire, MD 03/07/23 1146

## 2023-03-06 DIAGNOSIS — B349 Viral infection, unspecified: Secondary | ICD-10-CM | POA: Diagnosis not present

## 2023-03-06 DIAGNOSIS — D696 Thrombocytopenia, unspecified: Secondary | ICD-10-CM | POA: Diagnosis not present

## 2023-03-13 DIAGNOSIS — D696 Thrombocytopenia, unspecified: Secondary | ICD-10-CM | POA: Diagnosis not present

## 2023-03-13 DIAGNOSIS — B349 Viral infection, unspecified: Secondary | ICD-10-CM | POA: Diagnosis not present

## 2023-03-21 DIAGNOSIS — R569 Unspecified convulsions: Secondary | ICD-10-CM | POA: Diagnosis not present

## 2023-04-30 ENCOUNTER — Telehealth: Payer: Self-pay | Admitting: Adult Health

## 2023-04-30 ENCOUNTER — Encounter: Payer: Self-pay | Admitting: Adult Health

## 2023-04-30 ENCOUNTER — Ambulatory Visit (INDEPENDENT_AMBULATORY_CARE_PROVIDER_SITE_OTHER): Payer: Medicare Other | Admitting: Adult Health

## 2023-04-30 VITALS — BP 139/69 | HR 58 | Ht 66.0 in | Wt 217.5 lb

## 2023-04-30 DIAGNOSIS — Z780 Asymptomatic menopausal state: Secondary | ICD-10-CM | POA: Diagnosis not present

## 2023-04-30 DIAGNOSIS — Z1211 Encounter for screening for malignant neoplasm of colon: Secondary | ICD-10-CM

## 2023-04-30 DIAGNOSIS — Z01419 Encounter for gynecological examination (general) (routine) without abnormal findings: Secondary | ICD-10-CM | POA: Diagnosis not present

## 2023-04-30 DIAGNOSIS — R413 Other amnesia: Secondary | ICD-10-CM

## 2023-04-30 LAB — HEMOCCULT GUIAC POC 1CARD (OFFICE): Fecal Occult Blood, POC: NEGATIVE

## 2023-04-30 NOTE — Telephone Encounter (Signed)
Pt aware labs have been rechecked and OK per Jewel.

## 2023-04-30 NOTE — Progress Notes (Signed)
Patient ID: Stacey Ford, female   DOB: 1966-05-01, 57 y.o.   MRN: [id number] History of Present Illness: Stacey Ford is a 57 year old white female, married, PM in for a well woman gyn exam. Has memory issues, seen at [facility], and tested, did not have to start any meds. She had episode first of July felt weak and fell, has some abnormal labs and Dr Ouida Sills had rechecked but she is not sure what.     Component Value Date/Time   DIAGPAP  04/24/2022 0937    - Negative for intraepithelial lesion or malignancy (NILM)   DIAGPAP  10/01/2018 0000    NEGATIVE FOR INTRAEPITHELIAL LESIONS OR MALIGNANCY.   HPVHIGH Negative 04/24/2022 0937   ADEQPAP  04/24/2022 0937    Satisfactory for evaluation. The presence or absence of an   ADEQPAP  04/24/2022 0937    endocervical/transformation zone component cannot be determined because   ADEQPAP of atrophy. 04/24/2022 4403    PCP is Dr Ouida Sills.    Current Medications, Allergies, Past Medical History, Past Surgical History, Family History and Social History were reviewed in [employer] record.     Review of Systems: Patient denies any daily headaches,but has had headache, hearing loss, fatigue, blurred vision, shortness of breath, chest pain, abdominal pain, problems with bowel movements, urination, or intercourse(not active). No joint pain or mood swings. Has memory issues, was teary when  talking about it.    Physical Exam:BP 139/69 (BP Location: Right Arm, Patient Position: Sitting, Cuff Size: Normal)   Pulse (!) 58   Ht 5\' 6"  (1.676 m)   Wt 217 lb 8 oz (98.7 kg)   LMP 07/22/2018 (Approximate)   BMI 35.11 kg/m   General:  Well developed, well nourished, no acute distress Skin:  Warm and dry Neck:  Midline trachea, normal thyroid, good ROM, no lymphadenopathy Lungs; Clear to auscultation bilaterally Breast:  No dominant palpable mass, retraction, or nipple discharge Cardiovascular: Regular rate and rhythm Abdomen:  Soft, non tender, no  hepatosplenomegaly Pelvic:  External genitalia is normal in appearance, no lesions.  The vagina is pale and atrophic. Urethra has no lesions or masses. The cervix is smooth.  Uterus is felt to be normal size, shape, and contour.  No adnexal masses or tenderness noted.Bladder is non tender, no masses felt. Rectal: Good sphincter tone, no polyps, or hemorrhoids felt.  Hemoccult negative. Extremities/musculoskeletal:  No swelling or varicosities noted, no clubbing or cyanosis Psych:  No mood changes, alert and cooperative,seems happy AA is 0 Fall risk is low    04/30/2023   11:11 AM 04/24/2022    9:31 AM 04/21/2021   10:43 AM  Depression screen PHQ 2/9  Decreased Interest 1 0 0  Down, Depressed, Hopeless 1 0 0  PHQ - 2 Score 2 0 0  Altered sleeping 0 0 0  Tired, decreased energy 1 0 0  Change in appetite 0 0 0  Feeling bad or failure about yourself  0 0 0  Trouble concentrating 1 0 0  Moving slowly or fidgety/restless 0 0 0  Suicidal thoughts 0 0 0  PHQ-9 Score 4 0 0       04/30/2023   11:12 AM 04/24/2022    9:31 AM 04/21/2021   10:43 AM 03/10/2021    2:35 PM  GAD 7 : Generalized Anxiety Score  Nervous, Anxious, on Edge 0 0 0 0  Control/stop worrying 0 0 0 0  Worry too much - different things 3 0 0  0  Trouble relaxing 0 0 0 0  Restless 1 0 0 0  Easily annoyed or irritable 0 0 0 0  Afraid - awful might happen 0 0 0 0  Total GAD 7 Score 4 0 0 0    Upstream - 04/30/23 1036       Pregnancy Intention Screening   Does the patient want to become pregnant in the next year? N/A    Does the patient's partner want to become pregnant in the next year? N/A    Would the patient like to discuss contraceptive options today? N/A      Contraception Wrap Up   Current Method No Method - Other Reason   postmenopausal   Reason for No Current Contraceptive Method at Intake ([facility] Only) Other    End Method No Method - Other Reason   postmenopausal   Contraception Counseling Provided No               Examination chaperoned by Malachy Mood LPN   Impression and Plan: 1. Encounter for well woman exam with routine gynecological exam Physical in 1 year Pap in 2026 Labs with PCP, did call Jewel at [facility] and left message about ? Labs, I will all Ivori at [phone]  Colonoscopy per GI  Mammogram was negative 12/12/22   2. Encounter for screening fecal occult blood testing Hemoccult was negative  - POCT occult blood stool  3. Postmenopause Denies any vaginal bleeding   4. Memory changes She is doing word puzzles and trying to increase protein Has started going to church and getting out more

## 2023-05-31 DIAGNOSIS — G3184 Mild cognitive impairment, so stated: Secondary | ICD-10-CM | POA: Diagnosis not present

## 2023-05-31 DIAGNOSIS — Z9889 Other specified postprocedural states: Secondary | ICD-10-CM | POA: Diagnosis not present

## 2023-06-11 DIAGNOSIS — R7301 Impaired fasting glucose: Secondary | ICD-10-CM | POA: Diagnosis not present

## 2023-06-11 DIAGNOSIS — E785 Hyperlipidemia, unspecified: Secondary | ICD-10-CM | POA: Diagnosis not present

## 2023-06-11 DIAGNOSIS — N1831 Chronic kidney disease, stage 3a: Secondary | ICD-10-CM | POA: Diagnosis not present

## 2023-06-18 DIAGNOSIS — E785 Hyperlipidemia, unspecified: Secondary | ICD-10-CM | POA: Diagnosis not present

## 2023-06-18 DIAGNOSIS — A065 Amebic lung abscess: Secondary | ICD-10-CM | POA: Diagnosis not present

## 2023-06-18 DIAGNOSIS — R001 Bradycardia, unspecified: Secondary | ICD-10-CM | POA: Diagnosis not present

## 2023-06-18 DIAGNOSIS — G3184 Mild cognitive impairment, so stated: Secondary | ICD-10-CM | POA: Diagnosis not present

## 2023-06-18 DIAGNOSIS — N1831 Chronic kidney disease, stage 3a: Secondary | ICD-10-CM | POA: Diagnosis not present

## 2023-06-18 DIAGNOSIS — Z23 Encounter for immunization: Secondary | ICD-10-CM | POA: Diagnosis not present

## 2023-06-26 DIAGNOSIS — L570 Actinic keratosis: Secondary | ICD-10-CM | POA: Diagnosis not present

## 2023-06-26 DIAGNOSIS — L821 Other seborrheic keratosis: Secondary | ICD-10-CM | POA: Diagnosis not present

## 2023-07-26 DIAGNOSIS — H0288A Meibomian gland dysfunction right eye, upper and lower eyelids: Secondary | ICD-10-CM | POA: Diagnosis not present

## 2023-08-14 DIAGNOSIS — R569 Unspecified convulsions: Secondary | ICD-10-CM | POA: Diagnosis not present

## 2023-08-14 DIAGNOSIS — G3184 Mild cognitive impairment, so stated: Secondary | ICD-10-CM | POA: Diagnosis not present

## 2023-08-14 DIAGNOSIS — Z79899 Other long term (current) drug therapy: Secondary | ICD-10-CM | POA: Diagnosis not present

## 2023-09-13 DIAGNOSIS — G3184 Mild cognitive impairment, so stated: Secondary | ICD-10-CM | POA: Diagnosis not present

## 2023-09-28 DIAGNOSIS — G309 Alzheimer's disease, unspecified: Secondary | ICD-10-CM | POA: Diagnosis not present

## 2023-09-28 DIAGNOSIS — G3184 Mild cognitive impairment, so stated: Secondary | ICD-10-CM | POA: Diagnosis not present

## 2023-10-30 ENCOUNTER — Other Ambulatory Visit (HOSPITAL_COMMUNITY): Payer: Self-pay | Admitting: Internal Medicine

## 2023-10-30 DIAGNOSIS — Z1231 Encounter for screening mammogram for malignant neoplasm of breast: Secondary | ICD-10-CM

## 2023-11-08 DIAGNOSIS — G309 Alzheimer's disease, unspecified: Secondary | ICD-10-CM | POA: Diagnosis not present

## 2023-11-08 DIAGNOSIS — G9389 Other specified disorders of brain: Secondary | ICD-10-CM | POA: Diagnosis not present

## 2023-11-08 DIAGNOSIS — G3184 Mild cognitive impairment, so stated: Secondary | ICD-10-CM | POA: Diagnosis not present

## 2023-12-13 ENCOUNTER — Encounter (HOSPITAL_COMMUNITY): Payer: Self-pay

## 2023-12-13 ENCOUNTER — Ambulatory Visit (HOSPITAL_COMMUNITY)
Admission: RE | Admit: 2023-12-13 | Discharge: 2023-12-13 | Disposition: A | Discharge status: Home / Self Care | Payer: Medicare Other | Source: Ambulatory Visit | Attending: Internal Medicine | Admitting: Internal Medicine

## 2023-12-13 DIAGNOSIS — Z1231 Encounter for screening mammogram for malignant neoplasm of breast: Secondary | ICD-10-CM | POA: Diagnosis not present

## 2024-04-10 DIAGNOSIS — H0289 Other specified disorders of eyelid: Secondary | ICD-10-CM | POA: Diagnosis not present

## 2024-04-10 DIAGNOSIS — H524 Presbyopia: Secondary | ICD-10-CM | POA: Diagnosis not present

## 2024-05-12 ENCOUNTER — Encounter: Payer: Self-pay | Admitting: Adult Health

## 2024-05-12 ENCOUNTER — Ambulatory Visit: Admitting: Adult Health

## 2024-05-12 VITALS — BP 139/84 | HR 81 | Ht 66.5 in | Wt 235.0 lb

## 2024-05-12 DIAGNOSIS — Z780 Asymptomatic menopausal state: Secondary | ICD-10-CM | POA: Diagnosis not present

## 2024-05-12 DIAGNOSIS — R569 Unspecified convulsions: Secondary | ICD-10-CM

## 2024-05-12 DIAGNOSIS — G3184 Mild cognitive impairment, so stated: Secondary | ICD-10-CM

## 2024-05-12 DIAGNOSIS — F419 Anxiety disorder, unspecified: Secondary | ICD-10-CM

## 2024-05-12 DIAGNOSIS — R232 Flushing: Secondary | ICD-10-CM | POA: Diagnosis not present

## 2024-05-12 DIAGNOSIS — Z1211 Encounter for screening for malignant neoplasm of colon: Secondary | ICD-10-CM

## 2024-05-12 DIAGNOSIS — Z01419 Encounter for gynecological examination (general) (routine) without abnormal findings: Secondary | ICD-10-CM

## 2024-05-12 LAB — HEMOCCULT GUIAC POC 1CARD (OFFICE): Fecal Occult Blood, POC: NEGATIVE

## 2024-05-12 NOTE — Progress Notes (Signed)
 Patient ID: Stacey Ford, female   DOB: Dec 11, 1965, 57 y.o.   MRN: [id number] History of Present Illness: Stacey Ford is a 58 year old white female, married, G0P0, in for a well woman gyn exam. She has been seen at [facility] this year and had MRI, for MCI and memory loss, they offered trial medication that she did not want to do. She is teary when discussing today.     Component Value Date/Time   DIAGPAP  04/24/2022 0937    - Negative for intraepithelial lesion or malignancy (NILM)   DIAGPAP  10/01/2018 0000    NEGATIVE FOR INTRAEPITHELIAL LESIONS OR MALIGNANCY.   HPVHIGH Negative 04/24/2022 0937   ADEQPAP  04/24/2022 0937    Satisfactory for evaluation. The presence or absence of an   ADEQPAP  04/24/2022 0937    endocervical/transformation zone component cannot be determined because   ADEQPAP of atrophy. 04/24/2022 9062   PCP is Dr Sheryle    Current Medications, Allergies, Past Medical History, Past Surgical History, Family History and Social History were reviewed in [employer] electronic medical record.     Review of Systems: Patient denies any headaches, hearing loss, fatigue, blurred vision, shortness of breath, chest pain, abdominal pain, problems with bowel movements, urination, or intercourse(not active). No joint pain or mood swings.  Has more hot flashes last month or 2  Denies any vaginal bleeding +memory loss, is not driving much any more    Physical Exam:BP 139/84 (BP Location: Right Arm, Patient Position: Sitting, Cuff Size: Large)   Pulse 81   Ht 5' 6.5 (1.689 m)   Wt 235 lb (106.6 kg)   LMP 07/22/2018 (Approximate)   BMI 37.36 kg/m   General:  Well developed, well nourished, no acute distress Skin:  Warm and dry Neck:  Midline trachea, normal thyroid , good ROM, no lymphadenopathy,no carotid bruits heard Lungs; Clear to auscultation bilaterally Breast:  No dominant palpable mass, retraction, or nipple discharge Cardiovascular: Regular rate and rhythm Abdomen:  Soft,  non tender, no hepatosplenomegaly Pelvic:  External genitalia is normal in appearance, no lesions.  The vagina is pale, small Pederson speculum used. Urethra has no lesions or masses. The cervix is smooth.  Uterus is felt to be normal size, shape, and contour.  No adnexal masses or tenderness noted.Bladder is non tender, no masses felt. Rectal: Good sphincter tone, no polyps, or hemorrhoids felt.  Hemoccult negative. Extremities/musculoskeletal:  No swelling or varicosities noted, no clubbing or cyanosis Psych:  No mood changes, alert and cooperative,seems happy AA is 0 Fall risk is low    05/12/2024    1:43 PM 04/30/2023   11:11 AM 04/24/2022    9:31 AM  Depression screen PHQ 2/9  Decreased Interest 3 1 0  Down, Depressed, Hopeless 3 1 0  PHQ - 2 Score 6 2 0  Altered sleeping 1 0 0  Tired, decreased energy 0 1 0  Change in appetite 0 0 0  Feeling bad or failure about yourself  0 0 0  Trouble concentrating 0 1 0  Moving slowly or fidgety/restless 0 0 0  Suicidal thoughts 0 0 0  PHQ-9 Score 7 4 0       05/12/2024    1:43 PM 04/30/2023   11:12 AM 04/24/2022    9:31 AM 04/21/2021   10:43 AM  GAD 7 : Generalized Anxiety Score  Nervous, Anxious, on Edge 3 0 0 0  Control/stop worrying 3 0 0 0  Worry too much - different things  3 3 0 0  Trouble relaxing 3 0 0 0  Restless 1 1 0 0  Easily annoyed or irritable 0 0 0 0  Afraid - awful might happen 0 0 0 0  Total GAD 7 Score 13 4 0 0    Upstream - 05/12/24 1328       Pregnancy Intention Screening   Does the patient want to become pregnant in the next year? No    Does the patient's partner want to become pregnant in the next year? No    Would the patient like to discuss contraceptive options today? No      Contraception Wrap Up   Current Method Abstinence   PM   End Method Abstinence   PM   Contraception Counseling Provided No           Examination chaperoned by Clarita Salt LPN  Impression and plan: 1. Encounter for well woman  exam with routine gynecological exam (Primary) Pap and physical in 1 year Labs with PCP Mammogram was negative 12/13/23 Colonoscopy per GI   2. Encounter for screening fecal occult blood testing Hemoccult was negative  - POCT occult blood stool  3. Postmenopause Denies any vaginal bleeding   4. Hot flashes Having hot flashes more for last month or 2   5. Seizures (HCC) On Keppra   6. MCI (mild cognitive impairment) with memory loss Was seen at [facility], does not want to take trial medication they offered, talk with Dr Sheryle, may want to see neurologist in [place] to see what they have to offer, or follow up at [facility]   7. Anxiety Call and make appt with Dr Sheryle

## 2024-06-01 ENCOUNTER — Emergency Department (HOSPITAL_COMMUNITY)
Admission: EM | Admit: 2024-06-01 | Discharge: 2024-06-01 | Disposition: A | Discharge status: Home / Self Care | Attending: Emergency Medicine | Admitting: Emergency Medicine

## 2024-06-01 ENCOUNTER — Other Ambulatory Visit: Payer: Self-pay

## 2024-06-01 DIAGNOSIS — Z5321 Procedure and treatment not carried out due to patient leaving prior to being seen by health care provider: Secondary | ICD-10-CM | POA: Insufficient documentation

## 2024-06-01 DIAGNOSIS — R519 Headache, unspecified: Secondary | ICD-10-CM | POA: Insufficient documentation

## 2024-06-01 DIAGNOSIS — R059 Cough, unspecified: Secondary | ICD-10-CM | POA: Insufficient documentation

## 2024-06-01 LAB — RESP PANEL BY RT-PCR (RSV, FLU A&B, COVID)  RVPGX2
Influenza A by PCR: NEGATIVE
Influenza B by PCR: NEGATIVE
Resp Syncytial Virus by PCR: NEGATIVE
SARS Coronavirus 2 by RT PCR: NEGATIVE

## 2024-06-01 NOTE — ED Provider Notes (Signed)
 Patient left without being seen after triage   Stacey Sid SAILOR, MD 06/01/24 [phone]

## 2024-06-01 NOTE — ED Notes (Signed)
 In to assess pt and she starts crying stating she has been here 6 hours and ready to go, attempted to explain risks and ask if she would sign AMA and pt states I dont want to sign nothing. Pt walked out. Nad.

## 2024-06-01 NOTE — ED Triage Notes (Signed)
 Pt c/o cough x 1 week, states she was coughing up yellow mucus and then white mucus. Pt also endorses HA rates pain 8/10

## 2024-06-02 NOTE — ED Provider Notes (Signed)
   I was informed by nursing staff that patient left the department prior to my evaluation.   Herlinda Milling, PA-C 06/02/24 1409    Franklyn Sid SAILOR, MD 06/02/24 1444

## 2024-06-17 DIAGNOSIS — E785 Hyperlipidemia, unspecified: Secondary | ICD-10-CM | POA: Diagnosis not present

## 2024-06-17 DIAGNOSIS — N1831 Chronic kidney disease, stage 3a: Secondary | ICD-10-CM | POA: Diagnosis not present

## 2024-06-24 DIAGNOSIS — Z0001 Encounter for general adult medical examination with abnormal findings: Secondary | ICD-10-CM | POA: Diagnosis not present

## 2024-06-24 DIAGNOSIS — G3184 Mild cognitive impairment, so stated: Secondary | ICD-10-CM | POA: Diagnosis not present

## 2024-06-24 DIAGNOSIS — N1831 Chronic kidney disease, stage 3a: Secondary | ICD-10-CM | POA: Diagnosis not present

## 2024-06-24 DIAGNOSIS — Z23 Encounter for immunization: Secondary | ICD-10-CM | POA: Diagnosis not present

## 2024-06-24 DIAGNOSIS — G629 Polyneuropathy, unspecified: Secondary | ICD-10-CM | POA: Diagnosis not present
